# Patient Record
Sex: Female | Born: 1950
Health system: Southern US, Community
[De-identification: ages and names within clinical notes are randomized; demographics above are authoritative.]

## PROBLEM LIST (undated history)

## (undated) DIAGNOSIS — C50919 Malignant neoplasm of unspecified site of unspecified female breast: Secondary | ICD-10-CM

## (undated) DIAGNOSIS — T7840XA Allergy, unspecified, initial encounter: Secondary | ICD-10-CM

## (undated) DIAGNOSIS — C801 Malignant (primary) neoplasm, unspecified: Secondary | ICD-10-CM

## (undated) HISTORY — DX: Malignant (primary) neoplasm, unspecified: C80.1

## (undated) HISTORY — PX: MASTECTOMY: SHX3

## (undated) HISTORY — DX: Allergy, unspecified, initial encounter: T78.40XA

---
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
## 1987-09-20 HISTORY — PX: TUBAL LIGATION: SHX77

## 1993-09-19 HISTORY — PX: BREAST BIOPSY: SHX20

## 2001-09-19 HISTORY — PX: BREAST SURGERY: SHX581

## 2001-09-19 HISTORY — PX: OTHER SURGICAL HISTORY: SHX169

## 2002-09-19 DIAGNOSIS — C801 Malignant (primary) neoplasm, unspecified: Secondary | ICD-10-CM

## 2002-09-19 HISTORY — DX: Malignant (primary) neoplasm, unspecified: C80.1

## 2013-10-22 ENCOUNTER — Encounter (INDEPENDENT_AMBULATORY_CARE_PROVIDER_SITE_OTHER): Payer: Self-pay

## 2013-10-22 ENCOUNTER — Ambulatory Visit (INDEPENDENT_AMBULATORY_CARE_PROVIDER_SITE_OTHER): Payer: BC Managed Care – PPO | Admitting: Family

## 2013-10-22 ENCOUNTER — Encounter: Payer: Self-pay | Admitting: Family

## 2013-10-22 ENCOUNTER — Other Ambulatory Visit (HOSPITAL_COMMUNITY)
Admission: RE | Admit: 2013-10-22 | Discharge: 2013-10-22 | Disposition: A | Discharge status: Home / Self Care | Payer: BC Managed Care – PPO | Source: Ambulatory Visit | Attending: Family | Admitting: Family

## 2013-10-22 VITALS — BP 120/76 | HR 69 | Ht 65.5 in | Wt 159.0 lb

## 2013-10-22 DIAGNOSIS — Z853 Personal history of malignant neoplasm of breast: Secondary | ICD-10-CM

## 2013-10-22 DIAGNOSIS — Z0000 Encounter for general adult medical examination without abnormal findings: Secondary | ICD-10-CM

## 2013-10-22 DIAGNOSIS — Z124 Encounter for screening for malignant neoplasm of cervix: Secondary | ICD-10-CM

## 2013-10-22 DIAGNOSIS — Z01419 Encounter for gynecological examination (general) (routine) without abnormal findings: Secondary | ICD-10-CM | POA: Insufficient documentation

## 2013-10-22 NOTE — Progress Notes (Signed)
   Subjective:    Patient ID: Abigail Watkins, female    DOB: 05/07/1951, 63 y.o.   MRN: [id number]  HPI  63 year old white female, new patient to the practice and to be established and for complete physical exam. Denies any concerns today. Has a history of breast cancer with right mastectomy in 2004. She is currently being managed by oncology and sharp in [place]. She recently relocated to [facility] from [place] after purchasing a house and moving in with her boyfriend. Reports her deceased husband related to cardiomyopathy.   Review of Systems  Constitutional: Negative.   HENT: Negative.   Eyes: Negative.   Respiratory: Negative.   Cardiovascular: Negative.   Gastrointestinal: Negative.   Endocrine: Negative.   Genitourinary: Negative.   Musculoskeletal: Negative.   Skin: Negative.   Allergic/Immunologic: Negative.   Neurological: Negative.   Hematological: Negative.   Psychiatric/Behavioral: Negative.    Past Medical History  Diagnosis Date  . Cancer     breast    History   Social History  . Marital Status: Single    Spouse Name: N/A    Number of Children: N/A  . Years of Education: N/A   Occupational History  . Not on file.   Social History Main Topics  . Smoking status: Former Research scientist (life sciences)  . Smokeless tobacco: Not on file  . Alcohol Use: Yes  . Drug Use: No  . Sexual Activity: Not on file   Other Topics Concern  . Not on file   Social History Narrative  . No narrative on file    Past Surgical History  Procedure Laterality Date  . Breast surgery    . Tonsillectomy and adenoidectomy      Family History  Problem Relation Age of Onset  . Cancer Maternal Grandmother   . Breast cancer Paternal Grandmother     No Known Allergies  No current outpatient prescriptions on file prior to visit.   No current facility-administered medications on file prior to visit.    BP 120/76  Pulse 69  Ht 5' 5.5" (1.664 m)  Wt 159 lb (72.122 kg)  BMI 26.05  kg/m2chart    Objective:   Physical Exam  Constitutional: She is oriented to person, place, and time. She appears well-developed and well-nourished.  HENT:  Head: Normocephalic and atraumatic.  Right Ear: External ear normal.  Left Ear: External ear normal.  Mouth/Throat: Oropharynx is clear and moist.  Eyes: Conjunctivae and EOM are normal. Pupils are equal, round, and reactive to light.  Neck: Normal range of motion. Neck supple. No thyromegaly present.  Cardiovascular: Normal rate, regular rhythm and normal heart sounds.   Pulmonary/Chest: Effort normal and breath sounds normal.  Abdominal: Soft. Bowel sounds are normal. She exhibits no distension. There is no tenderness. There is no rebound.  Genitourinary: Vagina normal and uterus normal. Guaiac negative stool. No vaginal discharge found.  Musculoskeletal: Normal range of motion. She exhibits no edema and no tenderness.  Neurological: She is alert and oriented to person, place, and time. She has normal reflexes. She displays normal reflexes. No cranial nerve deficit. Coordination normal.  Skin: Skin is warm and dry.  Psychiatric: She has a normal mood and affect.          Assessment & Plan:  1. CPX- Encouraged a healthy diets, exercise, monthly self breast exams. Refer for colonoscopy. Pap smear sent. Follow-up with oncology as scheduled.

## 2013-10-22 NOTE — Progress Notes (Signed)
Pre visit review using our clinic review tool, if applicable. No additional management support is needed unless otherwise documented below in the visit note. 

## 2013-10-22 NOTE — Patient Instructions (Signed)
Exercise to Stay Healthy Exercise helps you become and stay healthy. EXERCISE IDEAS AND TIPS Choose exercises that:  You enjoy.  Fit into your day. You do not need to exercise really hard to be healthy. You can do exercises at a slow or medium level and stay healthy. You can:  Stretch before and after working out.  Try yoga, Pilates, or tai chi.  Lift weights.  Walk fast, swim, jog, run, climb stairs, bicycle, dance, or rollerskate.  Take aerobic classes. Exercises that burn about 150 calories:  Running 1  miles in 15 minutes.  Playing volleyball for 45 to 60 minutes.  Washing and waxing a car for 45 to 60 minutes.  Playing touch football for 45 minutes.  Walking 1  miles in 35 minutes.  Pushing a stroller 1  miles in 30 minutes.  Playing basketball for 30 minutes.  Raking leaves for 30 minutes.  Bicycling 5 miles in 30 minutes.  Walking 2 miles in 30 minutes.  Dancing for 30 minutes.  Shoveling snow for 15 minutes.  Swimming laps for 20 minutes.  Walking up stairs for 15 minutes.  Bicycling 4 miles in 15 minutes.  Gardening for 30 to 45 minutes.  Jumping rope for 15 minutes.  Washing windows or floors for 45 to 60 minutes. Document Released: 10/08/2010 Document Revised: 11/28/2011 Document Reviewed: 10/08/2010 [employer] Patient Information 2014 [employer].  

## 2013-10-23 DIAGNOSIS — Z853 Personal history of malignant neoplasm of breast: Secondary | ICD-10-CM | POA: Insufficient documentation

## 2013-10-29 ENCOUNTER — Other Ambulatory Visit (INDEPENDENT_AMBULATORY_CARE_PROVIDER_SITE_OTHER): Payer: BC Managed Care – PPO

## 2013-10-29 ENCOUNTER — Encounter: Payer: Self-pay | Admitting: Internal Medicine

## 2013-10-29 DIAGNOSIS — Z0000 Encounter for general adult medical examination without abnormal findings: Secondary | ICD-10-CM

## 2013-10-29 LAB — LIPID PANEL
CHOLESTEROL: 163 mg/dL (ref 0–200)
HDL: 78 mg/dL (ref >39.00)
LDL CALC: 75 mg/dL (ref 0–99)
Total CHOL/HDL Ratio: 2
Triglycerides: 51 mg/dL (ref 0.0–149.0)
VLDL: 10.2 mg/dL (ref 0.0–40.0)

## 2013-10-29 LAB — TSH: TSH: 0.88 u[IU]/mL (ref 0.35–5.50)

## 2013-10-29 LAB — COMPREHENSIVE METABOLIC PANEL
ALBUMIN: 4 g/dL (ref 3.5–5.2)
ALK PHOS: 52 U/L (ref 39–117)
ALT: 13 U/L (ref 0–35)
AST: 16 U/L (ref 0–37)
BILIRUBIN TOTAL: 0.7 mg/dL (ref 0.3–1.2)
BUN: 14 mg/dL (ref 6–23)
CO2: 27 meq/L (ref 19–32)
Calcium: 8.8 mg/dL (ref 8.4–10.5)
Chloride: 106 mEq/L (ref 96–112)
Creatinine, Ser: 0.8 mg/dL (ref 0.4–1.2)
GFR: 81.34 mL/min (ref >60.00)
Glucose, Bld: 87 mg/dL (ref 70–99)
Potassium: 4.3 mEq/L (ref 3.5–5.1)
SODIUM: 140 meq/L (ref 135–145)
TOTAL PROTEIN: 6.5 g/dL (ref 6.0–8.3)

## 2013-10-29 LAB — CBC WITH DIFFERENTIAL/PLATELET
BASOS PCT: 0.5 % (ref 0.0–3.0)
Basophils Absolute: 0 10*3/uL (ref 0.0–0.1)
Eosinophils Absolute: 0.1 10*3/uL (ref 0.0–0.7)
Eosinophils Relative: 3 % (ref 0.0–5.0)
HCT: 43.2 % (ref 36.0–46.0)
HEMOGLOBIN: 14.4 g/dL (ref 12.0–15.0)
Lymphocytes Relative: 30.1 % (ref 12.0–46.0)
Lymphs Abs: 1.3 10*3/uL (ref 0.7–4.0)
MCHC: 33.3 g/dL (ref 30.0–36.0)
MCV: 96.1 fl (ref 78.0–100.0)
Monocytes Absolute: 0.4 10*3/uL (ref 0.1–1.0)
Monocytes Relative: 9.9 % (ref 3.0–12.0)
NEUTROS ABS: 2.5 10*3/uL (ref 1.4–7.7)
Neutrophils Relative %: 56.5 % (ref 43.0–77.0)
Platelets: 284 10*3/uL (ref 150.0–400.0)
RBC: 4.49 Mil/uL (ref 3.87–5.11)
RDW: 13.1 % (ref 11.5–14.6)
WBC: 4.4 10*3/uL — AB (ref 4.5–10.5)

## 2013-10-30 ENCOUNTER — Other Ambulatory Visit: Payer: Self-pay

## 2013-12-26 ENCOUNTER — Ambulatory Visit (AMBULATORY_SURGERY_CENTER): Payer: Self-pay | Admitting: *Deleted

## 2013-12-26 VITALS — Ht 66.0 in | Wt 156.6 lb

## 2013-12-26 DIAGNOSIS — Z1211 Encounter for screening for malignant neoplasm of colon: Secondary | ICD-10-CM

## 2013-12-26 MED ORDER — NA SULFATE-K SULFATE-MG SULF 17.5-3.13-1.6 GM/177ML PO SOLN: 1.0000 | Freq: Once | ORAL | Status: DC

## 2013-12-26 NOTE — Progress Notes (Signed)
No allergies to eggs or soy. No problems with anesthesia.  Pt given Emmi instructions for colonoscopy  

## 2013-12-31 ENCOUNTER — Encounter: Payer: Self-pay | Admitting: Internal Medicine

## 2014-01-07 ENCOUNTER — Telehealth: Payer: Self-pay | Admitting: Internal Medicine

## 2014-01-07 DIAGNOSIS — Z1211 Encounter for screening for malignant neoplasm of colon: Secondary | ICD-10-CM

## 2014-01-07 MED ORDER — NA SULFATE-K SULFATE-MG SULF 17.5-3.13-1.6 GM/177ML PO SOLN: ORAL | Status: DC

## 2014-01-07 NOTE — Telephone Encounter (Signed)
RX sent to pt's new pharmacy and she is made aware of this

## 2014-01-09 ENCOUNTER — Ambulatory Visit (AMBULATORY_SURGERY_CENTER): Payer: BC Managed Care – PPO | Admitting: Internal Medicine

## 2014-01-09 ENCOUNTER — Encounter: Payer: Self-pay | Admitting: Internal Medicine

## 2014-01-09 VITALS — BP 147/77 | HR 62 | Temp 96.5°F | Resp 15 | Ht 66.0 in | Wt 156.0 lb

## 2014-01-09 DIAGNOSIS — Z1211 Encounter for screening for malignant neoplasm of colon: Secondary | ICD-10-CM

## 2014-01-09 MED ORDER — SODIUM CHLORIDE 0.9 % IV SOLN: 500.0000 mL | INTRAVENOUS | Status: DC

## 2014-01-09 NOTE — Op Note (Signed)
[place]  [address]. [place], [address]   COLONOSCOPY PROCEDURE REPORT  PATIENT: Oktober, Glazer  MR#: [id number] BIRTHDATE: Dec 17, 1950 , 43  yrs. old GENDER: Female ENDOSCOPIST: Gatha Mayer, MD, FACG REFERRED [id number] Campbell, FNP-BC PROCEDURE DATE:  01/09/2014 PROCEDURE:   Colonoscopy, screening First Screening Colonoscopy - Avg.  risk and is 50 yrs.  old or older - No.  Prior Negative Screening - Now for repeat screening. 10 or more years since last screening  History of Adenoma - Now for follow-up colonoscopy & has been > or = to 3 yrs.  N/A  Polyps Removed Today? No.  Recommend repeat exam, <10 yrs? No. ASA CLASS:   Class II INDICATIONS:average risk screening and Last colonoscopy performed 10+ yrs ago. MEDICATIONS: propofol (Diprivan) 200mg  IV, MAC sedation, administered by CRNA, and These medications were titrated to patient response per physician's verbal order  DESCRIPTION OF PROCEDURE:   After the risks benefits and alternatives of the procedure were thoroughly explained, informed consent was obtained.  A digital rectal exam revealed no abnormalities of the rectum.   The [id number]  endoscope was introduced through the anus and advanced to the cecum, which was identified by both the appendix and ileocecal valve. No adverse events experienced.   The quality of the prep was excellent using Suprep  The instrument was then slowly withdrawn as the colon was fully examined.      COLON FINDINGS: A normal appearing cecum, ileocecal valve, and appendiceal orifice were identified.  The ascending, hepatic flexure, transverse, splenic flexure, descending, sigmoid colon and rectum appeared unremarkable.  No polyps or cancers were seen. Retroflexed views revealed no abnormalities. The time to cecum=2 minutes 06 seconds.  Withdrawal time=9 minutes 18 seconds.  The scope was withdrawn and the procedure completed. COMPLICATIONS: There were no  complications.  ENDOSCOPIC IMPRESSION: Normal colonoscopy  RECOMMENDATIONS: Repeat colonoscopy 10 years.   eSigned:  Gatha Mayer, MD, [facility] 01/09/2014 9:24 AM   cc: Roxy Cedar FNP and The Patient

## 2014-01-09 NOTE — Progress Notes (Signed)
Discussed with patient elevated blood pressures. Encouraged her to check blood pressures at home and keep a record and if consistently elevated 140/90's then to touch base with her primary care doctor.

## 2014-01-09 NOTE — Patient Instructions (Addendum)
Your colonoscopy was normal.  Next routine colonoscopy in 10 years - 2025  I appreciate the opportunity to care for you. Carl E. Gessner, MD, FACG   YOU HAD AN ENDOSCOPIC PROCEDURE TODAY AT THE [facility]: Refer to the procedure report that was given to you for any specific questions about what was found during the examination.  If the procedure report does not answer your questions, please call your gastroenterologist to clarify.  If you requested that your care partner not be given the details of your procedure findings, then the procedure report has been included in a sealed envelope for you to review at your convenience later.  YOU SHOULD EXPECT: Some feelings of bloating in the abdomen. Passage of more gas than usual.  Walking can help get rid of the air that was put into your GI tract during the procedure and reduce the bloating. If you had a lower endoscopy (such as a colonoscopy or flexible sigmoidoscopy) you may notice spotting of blood in your stool or on the toilet paper. If you underwent a bowel prep for your procedure, then you may not have a normal bowel movement for a few days.  DIET: Your first meal following the procedure should be a light meal and then it is ok to progress to your normal diet.  A half-sandwich or bowl of soup is an example of a good first meal.  Heavy or fried foods are harder to digest and may make you feel nauseous or bloated.  Likewise meals heavy in dairy and vegetables can cause extra gas to form and this can also increase the bloating.  Drink plenty of fluids but you should avoid alcoholic beverages for 24 hours.  ACTIVITY: Your care partner should take you home directly after the procedure.  You should plan to take it easy, moving slowly for the rest of the day.  You can resume normal activity the day after the procedure however you should NOT DRIVE or use heavy machinery for 24 hours (because of the sedation medicines used during the test).     SYMPTOMS TO REPORT IMMEDIATELY: A gastroenterologist can be reached at any hour.  During normal business hours, 8:30 AM to 5:00 PM Monday through Friday, call [phone].  After hours and on weekends, please call the GI answering service at [phone] who will take a message and have the physician on call contact you.   Following lower endoscopy (colonoscopy or flexible sigmoidoscopy):  Excessive amounts of blood in the stool  Significant tenderness or worsening of abdominal pains  Swelling of the abdomen that is new, acute  Fever of 100F or higher  FOLLOW UP: If any biopsies were taken you will be contacted by phone or by letter within the next 1-3 weeks.  Call your gastroenterologist if you have not heard about the biopsies in 3 weeks.  Our staff will call the home number listed on your records the next business day following your procedure to check on you and address any questions or concerns that you may have at that time regarding the information given to you following your procedure. This is a courtesy call and so if there is no answer at the home number and we have not heard from you through the emergency physician on call, we will assume that you have returned to your regular daily activities without incident.  SIGNATURES/CONFIDENTIALITY: You and/or your care partner have signed paperwork which will be entered into your electronic medical record.  These   signatures attest to the fact that that the information above on your After Visit Summary has been reviewed and is understood.  Full responsibility of the confidentiality of this discharge information lies with you and/or your care-partner. 

## 2014-01-10 ENCOUNTER — Telehealth: Payer: Self-pay | Admitting: *Deleted

## 2014-01-10 NOTE — Telephone Encounter (Signed)
  Follow up Call-  Call back number 01/09/2014  Post procedure Call Back phone  # [phone]  Permission to leave phone message Yes     Patient questions:  Do you have a fever, pain , or abdominal swelling? no Pain Score  0 *  Have you tolerated food without any problems? yes  Have you been able to return to your normal activities? yes  Do you have any questions about your discharge instructions: Diet   no Medications  no Follow up visit  no  Do you have questions or concerns about your Care? no  Actions: * If pain score is 4 or above: No action needed, pain <4.

## 2015-03-12 ENCOUNTER — Encounter: Payer: Self-pay | Admitting: Internal Medicine

## 2015-04-16 ENCOUNTER — Encounter: Payer: Self-pay | Admitting: Internal Medicine

## 2015-04-16 ENCOUNTER — Ambulatory Visit (INDEPENDENT_AMBULATORY_CARE_PROVIDER_SITE_OTHER): Payer: Medicare PPO | Admitting: Internal Medicine

## 2015-04-16 VITALS — BP 162/92 | HR 69 | Temp 98.3°F | Resp 16 | Ht 67.0 in | Wt 158.1 lb

## 2015-04-16 DIAGNOSIS — Z23 Encounter for immunization: Secondary | ICD-10-CM | POA: Diagnosis not present

## 2015-04-16 DIAGNOSIS — Z0000 Encounter for general adult medical examination without abnormal findings: Secondary | ICD-10-CM | POA: Diagnosis not present

## 2015-04-16 NOTE — Progress Notes (Signed)
Pre visit review using our clinic review tool, if applicable. No additional management support is needed unless otherwise documented below in the visit note. 

## 2015-04-16 NOTE — Patient Instructions (Signed)
We will have you check the blood pressure 1-2 times per month. We are looking for the top number to be <140 and the bottom number to be <90. If you notice a lot of times when it is high call the office to come back in sooner.   We will see you back in about 6 months just to check the blood pressure.   Keep up the good work with your health, we do not need any blood work today.   We have given you the tetanus shot today. Think about calling [employer] to see if they would cover the shingles shot.   Health Maintenance Adopting a healthy lifestyle and getting preventive care can go a long way to promote health and wellness. Talk with your health care provider about what schedule of regular examinations is right for you. This is a good chance for you to check in with your provider about disease prevention and staying healthy. In between checkups, there are plenty of things you can do on your own. Experts have done a lot of research about which lifestyle changes and preventive measures are most likely to keep you healthy. Ask your health care provider for more information. WEIGHT AND DIET  Eat a healthy diet  Be sure to include plenty of vegetables, fruits, low-fat dairy products, and lean protein.  Do not eat a lot of foods high in solid fats, added sugars, or salt.  Get regular exercise. This is one of the most important things you can do for your health.  Most adults should exercise for at least 150 minutes each week. The exercise should increase your heart rate and make you sweat (moderate-intensity exercise).  Most adults should also do strengthening exercises at least twice a week. This is in addition to the moderate-intensity exercise.  Maintain a healthy weight  Body mass index (BMI) is a measurement that can be used to identify possible weight problems. It estimates body fat based on height and weight. Your health care provider can help determine your BMI and help you achieve or maintain a  healthy weight.  For females 23 years of age and older:   A BMI below 18.5 is considered underweight.  A BMI of 18.5 to 24.9 is normal.  A BMI of 25 to 29.9 is considered overweight.  A BMI of 30 and above is considered obese.  Watch levels of cholesterol and blood lipids  You should start having your blood tested for lipids and cholesterol at 66 years of age, then have this test every 5 years.  You may need to have your cholesterol levels checked more often if:  Your lipid or cholesterol levels are high.  You are older than 66 years of age.  You are at high risk for heart disease.  CANCER SCREENING   Lung Cancer  Lung cancer screening is recommended for adults 21-68 years old who are at high risk for lung cancer because of a history of smoking.  A yearly low-dose CT scan of the lungs is recommended for people who:  Currently smoke.  Have quit within the past 15 years.  Have at least a 30-pack-year history of smoking. A pack year is smoking an average of one pack of cigarettes a day for 1 year.  Yearly screening should continue until it has been 15 years since you quit.  Yearly screening should stop if you develop a health problem that would prevent you from having lung cancer treatment.  Breast Cancer  Practice breast  self-awareness. This means understanding how your breasts normally appear and feel.  It also means doing regular breast self-exams. Let your health care provider know about any changes, no matter how small.  If you are in your 20s or 30s, you should have a clinical breast exam (CBE) by a health care provider every 1-3 years as part of a regular health exam.  If you are 57 or older, have a CBE every year. Also consider having a breast X-ray (mammogram) every year.  If you have a family history of breast cancer, talk to your health care provider about genetic screening.  If you are at high risk for breast cancer, talk to your health care provider  about having an MRI and a mammogram every year.  Breast cancer gene (BRCA) assessment is recommended for women who have family members with BRCA-related cancers. BRCA-related cancers include:  Breast.  Ovarian.  Tubal.  Peritoneal cancers.  Results of the assessment will determine the need for genetic counseling and BRCA1 and BRCA2 testing. Cervical Cancer Routine pelvic examinations to screen for cervical cancer are no longer recommended for nonpregnant women who are considered low risk for cancer of the pelvic organs (ovaries, uterus, and vagina) and who do not have symptoms. A pelvic examination may be necessary if you have symptoms including those associated with pelvic infections. Ask your health care provider if a screening pelvic exam is right for you.   The Pap test is the screening test for cervical cancer for women who are considered at risk.  If you had a hysterectomy for a problem that was not cancer or a condition that could lead to cancer, then you no longer need Pap tests.  If you are older than 65 years, and you have had normal Pap tests for the past 10 years, you no longer need to have Pap tests.  If you have had past treatment for cervical cancer or a condition that could lead to cancer, you need Pap tests and screening for cancer for at least 20 years after your treatment.  If you no longer get a Pap test, assess your risk factors if they change (such as having a new sexual partner). This can affect whether you should start being screened again.  Some women have medical problems that increase their chance of getting cervical cancer. If this is the case for you, your health care provider may recommend more frequent screening and Pap tests.  The human papillomavirus (HPV) test is another test that may be used for cervical cancer screening. The HPV test looks for the virus that can cause cell changes in the cervix. The cells collected during the Pap test can be tested for  HPV.  The HPV test can be used to screen women 40 years of age and older. Getting tested for HPV can extend the interval between normal Pap tests from three to five years.  An HPV test also should be used to screen women of any age who have unclear Pap test results.  After 66 years of age, women should have HPV testing as often as Pap tests.  Colorectal Cancer  This type of cancer can be detected and often prevented.  Routine colorectal cancer screening usually begins at 66 years of age and continues through 66 years of age.  Your health care provider may recommend screening at an earlier age if you have risk factors for colon cancer.  Your health care provider may also recommend using home test kits to check  for hidden blood in the stool.  A small camera at the end of a tube can be used to examine your colon directly (sigmoidoscopy or colonoscopy). This is done to check for the earliest forms of colorectal cancer.  Routine screening usually begins at age 9.  Direct examination of the colon should be repeated every 5-10 years through 66 years of age. However, you may need to be screened more often if early forms of precancerous polyps or small growths are found. Skin Cancer  Check your skin from head to toe regularly.  Tell your health care provider about any new moles or changes in moles, especially if there is a change in a mole's shape or color.  Also tell your health care provider if you have a mole that is larger than the size of a pencil eraser.  Always use sunscreen. Apply sunscreen liberally and repeatedly throughout the day.  Protect yourself by wearing long sleeves, pants, a wide-brimmed hat, and sunglasses whenever you are outside. HEART DISEASE, DIABETES, AND HIGH BLOOD PRESSURE   Have your blood pressure checked at least every 1-2 years. High blood pressure causes heart disease and increases the risk of stroke.  If you are between 21 years and 51 years old, ask  your health care provider if you should take aspirin to prevent strokes.  Have regular diabetes screenings. This involves taking a blood sample to check your fasting blood sugar level.  If you are at a normal weight and have a low risk for diabetes, have this test once every three years after 66 years of age.  If you are overweight and have a high risk for diabetes, consider being tested at a younger age or more often. PREVENTING INFECTION  Hepatitis B  If you have a higher risk for hepatitis B, you should be screened for this virus. You are considered at high risk for hepatitis B if:  You were born in a country where hepatitis B is common. Ask your health care provider which countries are considered high risk.  Your parents were born in a high-risk country, and you have not been immunized against hepatitis B (hepatitis B vaccine).  You have HIV or AIDS.  You use needles to inject street drugs.  You live with someone who has hepatitis B.  You have had sex with someone who has hepatitis B.  You get hemodialysis treatment.  You take certain medicines for conditions, including cancer, organ transplantation, and autoimmune conditions. Hepatitis C  Blood testing is recommended for:  Everyone born from [birth year] through 1967.  Anyone with known risk factors for hepatitis C. Sexually transmitted infections (STIs)  You should be screened for sexually transmitted infections (STIs) including gonorrhea and chlamydia if:  You are sexually active and are younger than 66 years of age.  You are older than 66 years of age and your health care provider tells you that you are at risk for this type of infection.  Your sexual activity has changed since you were last screened and you are at an increased risk for chlamydia or gonorrhea. Ask your health care provider if you are at risk.  If you do not have HIV, but are at risk, it may be recommended that you take a prescription medicine daily to  prevent HIV infection. This is called pre-exposure prophylaxis (PrEP). You are considered at risk if:  You are sexually active and do not regularly use condoms or know the HIV status of your partner(s).  You take drugs  by injection.  You are sexually active with a partner who has HIV. Talk with your health care provider about whether you are at high risk of being infected with HIV. If you choose to begin PrEP, you should first be tested for HIV. You should then be tested every 3 months for as long as you are taking PrEP.  PREGNANCY   If you are premenopausal and you may become pregnant, ask your health care provider about preconception counseling.  If you may become pregnant, take 400 to 800 micrograms (mcg) of folic acid every day.  If you want to prevent pregnancy, talk to your health care provider about birth control (contraception). OSTEOPOROSIS AND MENOPAUSE   Osteoporosis is a disease in which the bones lose minerals and strength with aging. This can result in serious bone fractures. Your risk for osteoporosis can be identified using a bone density scan.  If you are 27 years of age or older, or if you are at risk for osteoporosis and fractures, ask your health care provider if you should be screened.  Ask your health care provider whether you should take a calcium or vitamin D supplement to lower your risk for osteoporosis.  Menopause may have certain physical symptoms and risks.  Hormone replacement therapy may reduce some of these symptoms and risks. Talk to your health care provider about whether hormone replacement therapy is right for you.  HOME CARE INSTRUCTIONS   Schedule regular health, dental, and eye exams.  Stay current with your immunizations.   Do not use any tobacco products including cigarettes, chewing tobacco, or electronic cigarettes.  If you are pregnant, do not drink alcohol.  If you are breastfeeding, limit how much and how often you drink  alcohol.  Limit alcohol intake to no more than 1 drink per day for nonpregnant women. One drink equals 12 ounces of beer, 5 ounces of wine, or 1 ounces of hard liquor.  Do not use street drugs.  Do not share needles.  Ask your health care provider for help if you need support or information about quitting drugs.  Tell your health care provider if you often feel depressed.  Tell your health care provider if you have ever been abused or do not feel safe at home. Document Released: 03/21/2011 Document Revised: 01/20/2014 Document Reviewed: 08/07/2013 [facility] Patient Information 2015 [place], [place]. This information is not intended to replace advice given to you by your health care provider. Make sure you discuss any questions you have with your health care provider.

## 2015-04-16 NOTE — Progress Notes (Signed)
   Subjective:    Patient ID: Abigail Watkins, female    DOB: 08-19-1951, 64 y.o.   MRN: [id number]  HPI Here for medicare wellness, no new complaints. Please see A/P for status and treatment of chronic medical problems.   Diet: heart healthy Physical activity: active Depression/mood screen: negative Hearing: intact to whispered voice Visual acuity: grossly normal, performs annual eye exam  ADLs: capable Fall risk: none Home safety: good Cognitive evaluation: intact to orientation, naming, recall and repetition EOL planning: adv directives discussed  I have personally reviewed and have noted 1. The patient's medical and social history - reviewed today no changes 2. Their use of alcohol, tobacco or illicit drugs 3. Their current medications and supplements 4. The patient's functional ability including ADL's, fall risks, home safety risks and hearing or visual impairment. 5. Diet and physical activities 6. Evidence for depression or mood disorders 7. Care team reviewed and updated (available in snapshot)  Review of Systems  Constitutional: Negative for fever, activity change, appetite change, fatigue and unexpected weight change.  HENT: Negative.   Eyes: Negative.   Respiratory: Negative for cough, chest tightness, shortness of breath and wheezing.   Cardiovascular: Negative for chest pain, palpitations and leg swelling.  Gastrointestinal: Negative for nausea, abdominal pain, diarrhea, constipation and abdominal distention.  Musculoskeletal: Negative.   Skin: Negative.   Neurological: Negative.   Psychiatric/Behavioral: Negative.       Objective:   Physical Exam  Constitutional: She is oriented to person, place, and time. She appears well-developed and well-nourished.  HENT:  Head: Normocephalic and atraumatic.  Eyes: EOM are normal.  Neck: Normal range of motion.  Cardiovascular: Normal rate and regular rhythm.   Pulmonary/Chest: Effort normal and breath sounds normal. No  respiratory distress. She has no wheezes.  Abdominal: Soft. She exhibits no distension. There is no tenderness. There is no rebound.  Musculoskeletal: She exhibits no edema.  Neurological: She is alert and oriented to person, place, and time. Coordination normal.  Skin: Skin is warm and dry.  Psychiatric: She has a normal mood and affect.   Filed Vitals:   04/16/15 1034  BP: 162/92  Pulse: 69  Temp: 98.3 F (36.8 C)  TempSrc: Oral  Resp: 16  Height: 5\' 7"  (1.702 m)  Weight: 158 lb 1.9 oz (71.723 kg)  SpO2: 98%      Assessment & Plan:  Tdap given at visit.

## 2015-04-16 NOTE — Assessment & Plan Note (Addendum)
BP mildly elevated today but normal at home. Given Tdap today. She will contact her insurance about shingles shot. Up to date on colonoscopy. Ordered mammogram today. 10 year list of screening given to her at visit.

## 2015-09-03 ENCOUNTER — Encounter: Payer: Self-pay | Admitting: Family

## 2015-09-03 ENCOUNTER — Ambulatory Visit (INDEPENDENT_AMBULATORY_CARE_PROVIDER_SITE_OTHER): Payer: Medicare PPO | Admitting: Family

## 2015-09-03 VITALS — BP 142/96 | HR 86 | Temp 97.9°F | Resp 16 | Ht 65.5 in | Wt 149.0 lb

## 2015-09-03 DIAGNOSIS — J0100 Acute maxillary sinusitis, unspecified: Secondary | ICD-10-CM

## 2015-09-03 DIAGNOSIS — Z23 Encounter for immunization: Secondary | ICD-10-CM | POA: Diagnosis not present

## 2015-09-03 DIAGNOSIS — J019 Acute sinusitis, unspecified: Secondary | ICD-10-CM | POA: Insufficient documentation

## 2015-09-03 MED ORDER — AMOXICILLIN-POT CLAVULANATE 875-125 MG PO TABS: 1.0000 | ORAL_TABLET | Freq: Two times a day (BID) | ORAL | Status: DC

## 2015-09-03 MED ORDER — HYDROCOD POLST-CPM POLST ER 10-8 MG/5ML PO SUER: 5.0000 mL | Freq: Every evening | ORAL | Status: DC | PRN

## 2015-09-03 NOTE — Patient Instructions (Signed)
Thank you for choosing [facility].  Summary/Instructions:  Your prescription(s) have been submitted to your pharmacy or been printed and provided for you. Please take as directed and contact our office if you believe you are having problem(s) with the medication(s) or have any questions.  If your symptoms worsen or fail to improve, please contact our office for further instruction, or in case of emergency go directly to the emergency room at the closest medical facility.   General Recommendations:    Please drink plenty of fluids.  Get plenty of rest   Sleep in humidified air  Use saline nasal sprays  Netti pot   OTC Medications:  Decongestants - helps relieve congestion   Flonase (generic fluticasone) or Nasacort (generic triamcinolone) - please make sure to use the "cross-over" technique at a 45 degree angle towards the opposite eye as opposed to straight up the nasal passageway.   Sudafed (generic pseudoephedrine - Note this is the one that is available behind the pharmacy counter); Products with phenylephrine (-PE) may also be used but is often not as effective as pseudoephedrine.   If you have HIGH BLOOD PRESSURE - Coricidin HBP; AVOID any product that is -D as this contains pseudoephedrine which may increase your blood pressure.  Afrin (oxymetazoline) every 6-8 hours for up to 3 days.   Allergies - helps relieve runny nose, itchy eyes and sneezing   Claritin (generic loratidine), Allegra (fexofenidine), or Zyrtec (generic cyrterizine) for runny nose. These medications should not cause drowsiness.  Note - Benadryl (generic diphenhydramine) may be used however may cause drowsiness  Cough -   Delsym or Robitussin (generic dextromethorphan)  Expectorants - helps loosen mucus to ease removal   Mucinex (generic guaifenesin) as directed on the package.  Headaches / General Aches   Tylenol (generic acetaminophen) - DO NOT EXCEED 3 grams (3,000 mg) in a 24  hour time period  Advil/Motrin (generic ibuprofen)   Sore Throat -   Salt water gargle   Chloraseptic (generic benzocaine) spray or lozenges / Sucrets (generic dyclonine)      

## 2015-09-03 NOTE — Progress Notes (Signed)
Pre visit review using our clinic review tool, if applicable. No additional management support is needed unless otherwise documented below in the visit note. 

## 2015-09-03 NOTE — Progress Notes (Signed)
   Subjective:    Patient ID: Abigail Watkins, female    DOB: May 30, 1951, 64 y.o.   MRN: [id number]  Chief Complaint  Patient presents with  . Nasal Congestion    Congestion, chills, body aches, fever, productive cough, sinus headache and pressure    HPI:  Abigail Watkins is a 64 y.o. female who  has a past medical history of Allergy and Cancer ([place]) (2004). and presents today for an acute office visit.   This is a new problem. Asosciated symptom of congestion, chills, body aches, fever, productive cough, and sinus headache and pressure for about 2 weeks. T-max of 100 and the last fever was about 2 days ago. Modifying factors include Tylenol and Netti pot which have not helped greatly. Over the course she has gotten a little better but then worsened again. Denies any recent antibiotic use.   No Known Allergies   No current outpatient prescriptions on file prior to visit.   No current facility-administered medications on file prior to visit.    Review of Systems  Constitutional: Positive for fever and chills.  HENT: Positive for congestion, sinus pressure and sore throat.   Respiratory: Positive for cough. Negative for chest tightness and shortness of breath.   Neurological: Positive for headaches.      Objective:    BP 142/96 mmHg  Pulse 86  Temp(Src) 97.9 F (36.6 C) (Oral)  Resp 16  Ht 5' 5.5" (1.664 m)  Wt 149 lb (67.586 kg)  BMI 24.41 kg/m2  SpO2 98% Nursing note and vital signs reviewed.  Physical Exam  Constitutional: She is oriented to person, place, and time. She appears well-developed and well-nourished. No distress.  HENT:  Right Ear: Hearing, tympanic membrane, external ear and ear canal normal.  Left Ear: Hearing, tympanic membrane, external ear and ear canal normal.  Nose: Right sinus exhibits maxillary sinus tenderness and frontal sinus tenderness. Left sinus exhibits maxillary sinus tenderness and frontal sinus tenderness.  Mouth/Throat: Uvula is midline,  oropharynx is clear and moist and mucous membranes are normal.  Neck: Neck supple.  Cardiovascular: Normal rate, regular rhythm, normal heart sounds and intact distal pulses.   Pulmonary/Chest: Effort normal and breath sounds normal.  Neurological: She is alert and oriented to person, place, and time.  Skin: Skin is warm and dry.  Psychiatric: She has a normal mood and affect. Her behavior is normal. Judgment and thought content normal.       Assessment & Plan:   Problem List Items Addressed This Visit      Respiratory   Acute sinusitis    Symptoms and exam consistent with bacterial sinusitis. Start Augmentin. Start Tussionex as needed for cough and sleep. Continue over-the-counter medications as needed for comfort and supportive care. Follow-up if symptoms worsen or fail to improve.      Relevant Medications   amoxicillin-clavulanate (AUGMENTIN) 875-125 MG tablet   chlorpheniramine-HYDROcodone (TUSSIONEX PENNKINETIC ER) 10-8 MG/5ML SUER    Other Visit Diagnoses    Encounter for immunization    -  Primary

## 2015-09-03 NOTE — Assessment & Plan Note (Signed)
Symptoms and exam consistent with bacterial sinusitis. Start Augmentin. Start Tussionex as needed for cough and sleep. Continue over-the-counter medications as needed for comfort and supportive care. Follow-up if symptoms worsen or fail to improve.

## 2015-10-14 ENCOUNTER — Ambulatory Visit: Payer: Medicare PPO | Admitting: Internal Medicine

## 2015-12-24 ENCOUNTER — Ambulatory Visit: Payer: Medicare PPO | Admitting: Internal Medicine

## 2016-06-24 ENCOUNTER — Other Ambulatory Visit: Payer: Self-pay | Admitting: Internal Medicine

## 2016-06-24 DIAGNOSIS — Z1231 Encounter for screening mammogram for malignant neoplasm of breast: Secondary | ICD-10-CM

## 2016-07-13 ENCOUNTER — Ambulatory Visit
Admission: RE | Admit: 2016-07-13 | Discharge: 2016-07-13 | Disposition: A | Discharge status: Home / Self Care | Payer: Medicare Other | Source: Ambulatory Visit | Attending: Internal Medicine | Admitting: Internal Medicine

## 2016-07-13 DIAGNOSIS — Z1231 Encounter for screening mammogram for malignant neoplasm of breast: Secondary | ICD-10-CM

## 2016-07-26 ENCOUNTER — Encounter: Payer: Self-pay | Admitting: Internal Medicine

## 2016-07-26 ENCOUNTER — Other Ambulatory Visit (INDEPENDENT_AMBULATORY_CARE_PROVIDER_SITE_OTHER): Payer: Medicare Other

## 2016-07-26 ENCOUNTER — Ambulatory Visit (INDEPENDENT_AMBULATORY_CARE_PROVIDER_SITE_OTHER): Payer: Medicare Other | Admitting: Internal Medicine

## 2016-07-26 VITALS — BP 150/70 | HR 72 | Temp 98.0°F | Resp 14 | Ht 66.0 in | Wt 158.0 lb

## 2016-07-26 DIAGNOSIS — Z23 Encounter for immunization: Secondary | ICD-10-CM | POA: Diagnosis not present

## 2016-07-26 DIAGNOSIS — Z0000 Encounter for general adult medical examination without abnormal findings: Secondary | ICD-10-CM | POA: Diagnosis not present

## 2016-07-26 DIAGNOSIS — R030 Elevated blood-pressure reading, without diagnosis of hypertension: Secondary | ICD-10-CM

## 2016-07-26 DIAGNOSIS — Z853 Personal history of malignant neoplasm of breast: Secondary | ICD-10-CM

## 2016-07-26 LAB — CBC
HEMATOCRIT: 43 % (ref 36.0–46.0)
HEMOGLOBIN: 14.6 g/dL (ref 12.0–15.0)
MCHC: 34.1 g/dL (ref 30.0–36.0)
MCV: 93.1 fl (ref 78.0–100.0)
PLATELETS: 294 10*3/uL (ref 150.0–400.0)
RBC: 4.61 Mil/uL (ref 3.87–5.11)
RDW: 13.4 % (ref 11.5–15.5)
WBC: 6.1 10*3/uL (ref 4.0–10.5)

## 2016-07-26 LAB — LIPID PANEL
CHOL/HDL RATIO: 2
Cholesterol: 182 mg/dL (ref 0–200)
HDL: 82.2 mg/dL (ref >39.00)
LDL CALC: 87 mg/dL (ref 0–99)
NONHDL: 99.84
Triglycerides: 62 mg/dL (ref 0.0–149.0)
VLDL: 12.4 mg/dL (ref 0.0–40.0)

## 2016-07-26 LAB — COMPREHENSIVE METABOLIC PANEL
ALBUMIN: 4.4 g/dL (ref 3.5–5.2)
ALT: 11 U/L (ref 0–35)
AST: 13 U/L (ref 0–37)
Alkaline Phosphatase: 49 U/L (ref 39–117)
BUN: 18 mg/dL (ref 6–23)
CALCIUM: 9.4 mg/dL (ref 8.4–10.5)
CHLORIDE: 103 meq/L (ref 96–112)
CO2: 31 meq/L (ref 19–32)
Creatinine, Ser: 0.82 mg/dL (ref 0.40–1.20)
GFR: 73.88 mL/min (ref >60.00)
Glucose, Bld: 106 mg/dL — ABNORMAL HIGH (ref 70–99)
POTASSIUM: 4.4 meq/L (ref 3.5–5.1)
Sodium: 138 mEq/L (ref 135–145)
Total Bilirubin: 0.6 mg/dL (ref 0.2–1.2)
Total Protein: 6.8 g/dL (ref 6.0–8.3)

## 2016-07-26 NOTE — Addendum Note (Signed)
Addended by: Resa Miner R on: 07/26/2016 01:12 PM   Modules accepted: Orders

## 2016-07-26 NOTE — Progress Notes (Signed)
   Subjective:    Patient ID: Abigail Watkins, female    DOB: 11/13/1950, 65 y.o.   MRN: [id number]  HPI Here for medicare wellness and physical, no new complaints. Please see A/P for status and treatment of chronic medical problems.   Diet: heart healthy Physical activity: walks 4 miles daily Depression/mood screen: negative Hearing: intact to whispered voice Visual acuity: grossly normal, performs annual eye exam  ADLs: capable Fall risk: none Home safety: good Cognitive evaluation: intact to orientation, naming, recall and repetition EOL planning: adv directives discussed, in place  I have personally reviewed and have noted 1. The patient's medical and social history - reviewed today no changes 2. Their use of alcohol, tobacco or illicit drugs 3. Their current medications and supplements 4. The patient's functional ability including ADL's, fall risks, home safety risks and hearing or visual impairment. 5. Diet and physical activities 6. Evidence for depression or mood disorders 7. Care team reviewed and updated (available in snapshot)  Review of Systems  Constitutional: Negative.   HENT: Negative.   Eyes: Negative.   Respiratory: Negative for cough, chest tightness and shortness of breath.   Cardiovascular: Negative for chest pain, palpitations and leg swelling.  Gastrointestinal: Negative for abdominal distention, abdominal pain, constipation, diarrhea, nausea and vomiting.  Musculoskeletal: Negative.   Skin: Negative.   Neurological: Negative.   Psychiatric/Behavioral: Negative.       Objective:   Physical Exam  Constitutional: She is oriented to person, place, and time. She appears well-developed and well-nourished.  HENT:  Head: Normocephalic and atraumatic.  Eyes: EOM are normal.  Neck: Normal range of motion.  Cardiovascular: Normal rate and regular rhythm.   Pulmonary/Chest: Effort normal and breath sounds normal. No respiratory distress. She has no wheezes. She  has no rales.  Abdominal: Soft. Bowel sounds are normal. She exhibits no distension. There is no tenderness. There is no rebound.  Musculoskeletal: She exhibits no edema.  Neurological: She is alert and oriented to person, place, and time. Coordination normal.  Skin: Skin is warm and dry.  Psychiatric: She has a normal mood and affect.   Vitals:   07/26/16 1009 07/26/16 1050  BP: (!) 180/80 (!) 150/70  Pulse: 72   Resp: 14   Temp: 98 F (36.7 C)   TempSrc: Oral   SpO2: 98%   Weight: 158 lb (71.7 kg)   Height: 5\' 6"  (1.676 m)       Assessment & Plan:  Flu and prevnar 13 given at visit.

## 2016-07-26 NOTE — Assessment & Plan Note (Signed)
Recent mammogram normal. >10 years prior.

## 2016-07-26 NOTE — Assessment & Plan Note (Signed)
She denies it being high at home and will monitor with log and return with that next visit.

## 2016-07-26 NOTE — Progress Notes (Signed)
Pre visit review using our clinic review tool, if applicable. No additional management support is needed unless otherwise documented below in the visit note. 

## 2016-07-26 NOTE — Assessment & Plan Note (Signed)
BP mildly elevated and she will monitor at home. Checking labs and adjust as needed. Given flu and prevnar 13 shot at visit. Mammogram done recently and reviewed with her at visit, colonoscopy due in 2025. Counseled about sun safety and mole surveillance as well as the dangers of distracted driving. Given 10 year screening recommendations.

## 2016-07-26 NOTE — Patient Instructions (Addendum)
We will check the labs today and call you back with the results.   We have given you the pneumonia shot and the flu shot today.   For the blood pressure we are looking for the top number to be less than 140 and the bottom number to be less than 90. Call us if it is staying high.   Health Maintenance, Female Adopting a healthy lifestyle and getting preventive care can go a long way to promote health and wellness. Talk with your health care provider about what schedule of regular examinations is right for you. This is a good chance for you to check in with your provider about disease prevention and staying healthy. In between checkups, there are plenty of things you can do on your own. Experts have done a lot of research about which lifestyle changes and preventive measures are most likely to keep you healthy. Ask your health care provider for more information. WEIGHT AND DIET  Eat a healthy diet  Be sure to include plenty of vegetables, fruits, low-fat dairy products, and lean protein.  Do not eat a lot of foods high in solid fats, added sugars, or salt.  Get regular exercise. This is one of the most important things you can do for your health.  Most adults should exercise for at least 150 minutes each week. The exercise should increase your heart rate and make you sweat (moderate-intensity exercise).  Most adults should also do strengthening exercises at least twice a week. This is in addition to the moderate-intensity exercise.  Maintain a healthy weight  Body mass index (BMI) is a measurement that can be used to identify possible weight problems. It estimates body fat based on height and weight. Your health care provider can help determine your BMI and help you achieve or maintain a healthy weight.  For females 67 years of age and older:   A BMI below 18.5 is considered underweight.  A BMI of 18.5 to 24.9 is normal.  A BMI of 25 to 29.9 is considered overweight.  A BMI of 30 and  above is considered obese.  Watch levels of cholesterol and blood lipids  You should start having your blood tested for lipids and cholesterol at 67 years of age, then have this test every 5 years.  You may need to have your cholesterol levels checked more often if:  Your lipid or cholesterol levels are high.  You are older than 67 years of age.  You are at high risk for heart disease.  CANCER SCREENING   Lung Cancer  Lung cancer screening is recommended for adults 67-29 years old who are at high risk for lung cancer because of a history of smoking.  A yearly low-dose CT scan of the lungs is recommended for people who:  Currently smoke.  Have quit within the past 15 years.  Have at least a 30-pack-year history of smoking. A pack year is smoking an average of one pack of cigarettes a day for 1 year.  Yearly screening should continue until it has been 15 years since you quit.  Yearly screening should stop if you develop a health problem that would prevent you from having lung cancer treatment.  Breast Cancer  Practice breast self-awareness. This means understanding how your breasts normally appear and feel.  It also means doing regular breast self-exams. Let your health care provider know about any changes, no matter how small.  If you are in your 67s or 30s, you should have  a clinical breast exam (CBE) by a health care provider every 1-3 years as part of a regular health exam.  If you are 67 or older, have a CBE every year. Also consider having a breast X-ray (mammogram) every year.  If you have a family history of breast cancer, talk to your health care provider about genetic screening.  If you are at high risk for breast cancer, talk to your health care provider about having an MRI and a mammogram every year.  Breast cancer gene (BRCA) assessment is recommended for women who have family members with BRCA-related cancers. BRCA-related cancers  include:  Breast.  Ovarian.  Tubal.  Peritoneal cancers.  Results of the assessment will determine the need for genetic counseling and BRCA1 and BRCA2 testing. Cervical Cancer Your health care provider may recommend that you be screened regularly for cancer of the pelvic organs (ovaries, uterus, and vagina). This screening involves a pelvic examination, including checking for microscopic changes to the surface of your cervix (Pap test). You may be encouraged to have this screening done every 3 years, beginning at age 67.  For women ages 67-65, health care providers may recommend pelvic exams and Pap testing every 3 years, or they may recommend the Pap and pelvic exam, combined with testing for human papilloma virus (HPV), every 5 years. Some types of HPV increase your risk of cervical cancer. Testing for HPV may also be done on women of any age with unclear Pap test results.  Other health care providers may not recommend any screening for nonpregnant women who are considered low risk for pelvic cancer and who do not have symptoms. Ask your health care provider if a screening pelvic exam is right for you.  If you have had past treatment for cervical cancer or a condition that could lead to cancer, you need Pap tests and screening for cancer for at least 20 years after your treatment. If Pap tests have been discontinued, your risk factors (such as having a new sexual partner) need to be reassessed to determine if screening should resume. Some women have medical problems that increase the chance of getting cervical cancer. In these cases, your health care provider may recommend more frequent screening and Pap tests. Colorectal Cancer  This type of cancer can be detected and often prevented.  Routine colorectal cancer screening usually begins at 67 years of age and continues through 67 years of age.  Your health care provider may recommend screening at an earlier age if you have risk factors for  colon cancer.  Your health care provider may also recommend using home test kits to check for hidden blood in the stool.  A small camera at the end of a tube can be used to examine your colon directly (sigmoidoscopy or colonoscopy). This is done to check for the earliest forms of colorectal cancer.  Routine screening usually begins at age 39.  Direct examination of the colon should be repeated every 5-10 years through 67 years of age. However, you may need to be screened more often if early forms of precancerous polyps or small growths are found. Skin Cancer  Check your skin from head to toe regularly.  Tell your health care provider about any new moles or changes in moles, especially if there is a change in a mole's shape or color.  Also tell your health care provider if you have a mole that is larger than the size of a pencil eraser.  Always use sunscreen. Apply sunscreen  liberally and repeatedly throughout the day.  Protect yourself by wearing long sleeves, pants, a wide-brimmed hat, and sunglasses whenever you are outside. HEART DISEASE, DIABETES, AND HIGH BLOOD PRESSURE   High blood pressure causes heart disease and increases the risk of stroke. High blood pressure is more likely to develop in:  People who have blood pressure in the high end of the normal range (130-139/85-89 mm Hg).  People who are overweight or obese.  People who are African American.  If you are 40-36 years of age, have your blood pressure checked every 3-5 years. If you are 99 years of age or older, have your blood pressure checked every year. You should have your blood pressure measured twice--once when you are at a hospital or clinic, and once when you are not at a hospital or clinic. Record the average of the two measurements. To check your blood pressure when you are not at a hospital or clinic, you can use:  An automated blood pressure machine at a pharmacy.  A home blood pressure monitor.  If you  are between 28 years and 66 years old, ask your health care provider if you should take aspirin to prevent strokes.  Have regular diabetes screenings. This involves taking a blood sample to check your fasting blood sugar level.  If you are at a normal weight and have a low risk for diabetes, have this test once every three years after 67 years of age.  If you are overweight and have a high risk for diabetes, consider being tested at a younger age or more often. PREVENTING INFECTION  Hepatitis B  If you have a higher risk for hepatitis B, you should be screened for this virus. You are considered at high risk for hepatitis B if:  You were born in a country where hepatitis B is common. Ask your health care provider which countries are considered high risk.  Your parents were born in a high-risk country, and you have not been immunized against hepatitis B (hepatitis B vaccine).  You have HIV or AIDS.  You use needles to inject street drugs.  You live with someone who has hepatitis B.  You have had sex with someone who has hepatitis B.  You get hemodialysis treatment.  You take certain medicines for conditions, including cancer, organ transplantation, and autoimmune conditions. Hepatitis C  Blood testing is recommended for:  Everyone born from [birth year] through 1967.  Anyone with known risk factors for hepatitis C. Sexually transmitted infections (STIs)  You should be screened for sexually transmitted infections (STIs) including gonorrhea and chlamydia if:  You are sexually active and are younger than 67 years of age.  You are older than 67 years of age and your health care provider tells you that you are at risk for this type of infection.  Your sexual activity has changed since you were last screened and you are at an increased risk for chlamydia or gonorrhea. Ask your health care provider if you are at risk.  If you do not have HIV, but are at risk, it may be recommended that you  take a prescription medicine daily to prevent HIV infection. This is called pre-exposure prophylaxis (PrEP). You are considered at risk if:  You are sexually active and do not regularly use condoms or know the HIV status of your partner(s).  You take drugs by injection.  You are sexually active with a partner who has HIV. Talk with your health care provider about whether you  are at high risk of being infected with HIV. If you choose to begin PrEP, you should first be tested for HIV. You should then be tested every 3 months for as long as you are taking PrEP.  PREGNANCY   If you are premenopausal and you may become pregnant, ask your health care provider about preconception counseling.  If you may become pregnant, take 400 to 800 micrograms (mcg) of folic acid every day.  If you want to prevent pregnancy, talk to your health care provider about birth control (contraception). OSTEOPOROSIS AND MENOPAUSE   Osteoporosis is a disease in which the bones lose minerals and strength with aging. This can result in serious bone fractures. Your risk for osteoporosis can be identified using a bone density scan.  If you are 68 years of age or older, or if you are at risk for osteoporosis and fractures, ask your health care provider if you should be screened.  Ask your health care provider whether you should take a calcium or vitamin D supplement to lower your risk for osteoporosis.  Menopause may have certain physical symptoms and risks.  Hormone replacement therapy may reduce some of these symptoms and risks. Talk to your health care provider about whether hormone replacement therapy is right for you.  HOME CARE INSTRUCTIONS   Schedule regular health, dental, and eye exams.  Stay current with your immunizations.   Do not use any tobacco products including cigarettes, chewing tobacco, or electronic cigarettes.  If you are pregnant, do not drink alcohol.  If you are breastfeeding, limit how  much and how often you drink alcohol.  Limit alcohol intake to no more than 1 drink per day for nonpregnant women. One drink equals 12 ounces of beer, 5 ounces of wine, or 1 ounces of hard liquor.  Do not use street drugs.  Do not share needles.  Ask your health care provider for help if you need support or information about quitting drugs.  Tell your health care provider if you often feel depressed.  Tell your health care provider if you have ever been abused or do not feel safe at home.   This information is not intended to replace advice given to you by your health care provider. Make sure you discuss any questions you have with your health care provider.   Document Released: 03/21/2011 Document Revised: 09/26/2014 Document Reviewed: 08/07/2013 Elsevier Interactive Patient Education [employer].

## 2016-09-05 ENCOUNTER — Encounter: Payer: Self-pay | Admitting: Internal Medicine

## 2016-09-05 ENCOUNTER — Ambulatory Visit (INDEPENDENT_AMBULATORY_CARE_PROVIDER_SITE_OTHER): Payer: Medicare Other | Admitting: Internal Medicine

## 2016-09-05 DIAGNOSIS — M25511 Pain in right shoulder: Secondary | ICD-10-CM | POA: Diagnosis not present

## 2016-09-05 MED ORDER — PREDNISONE 20 MG PO TABS: 40.0000 mg | ORAL_TABLET | Freq: Every day | ORAL | 0 refills | Status: DC

## 2016-09-05 MED ORDER — KETOPROFEN 10 % CREA: TOPICAL_CREAM | 3 refills | Status: DC

## 2016-09-05 NOTE — Patient Instructions (Signed)
We have sent in prednisone for the inflammation. Take 2 pills daily for 5 days then stop.   We have sent in the cream for you as well to try for pain.   It is okay to keep using the ibuprofen for pain. And you can also try tylenol to help as well.

## 2016-09-05 NOTE — Assessment & Plan Note (Signed)
Rx for short burst prednisone and ketoprofen cream for the pain. No evidence of tendon tear on exam and no swelling. No red flag symptoms to need further evaluation now.

## 2016-09-05 NOTE — Progress Notes (Signed)
   Subjective:    Patient ID: Abigail Watkins, female    DOB: Nov 29, 1950, 65 y.o.   MRN: [id number]  HPI The patient is a 65 YO female coming in for right shoulder pain for about 2 weeks. No injury at onset or overuse. No fevers or chills. Some pain initially in the shoulder blade and shoulder itself. There is no limitation of ROM now. The pain is getting better gradually. She has been taking ibuprofen since it started. This has happened before about 1 year ago but went away sooner and she cannot recall anything that helped. She has also tried ice and heat and they did not help much.   Review of Systems  Constitutional: Positive for activity change. Negative for appetite change, chills, fatigue, fever and unexpected weight change.  Respiratory: Negative.   Cardiovascular: Negative.   Gastrointestinal: Negative.   Musculoskeletal: Positive for arthralgias. Negative for back pain, gait problem, joint swelling, myalgias, neck pain and neck stiffness.  Skin: Negative.       Objective:   Physical Exam  Constitutional: She is oriented to person, place, and time. She appears well-developed and well-nourished.  HENT:  Head: Normocephalic and atraumatic.  Eyes: EOM are normal.  Neck: Normal range of motion.  Cardiovascular: Normal rate and regular rhythm.   Pulmonary/Chest: Effort normal. No respiratory distress. She has no wheezes. She has no rales.  Abdominal: Soft. She exhibits no distension. There is no tenderness. There is no rebound.  Musculoskeletal: She exhibits tenderness.  Some soreness in the shoulder blade and the shoulder without radiation. Sensation intact and no weakness in the right arm.   Neurological: She is alert and oriented to person, place, and time.  Skin: Skin is warm and dry.   Vitals:   09/05/16 1106 09/05/16 1126  BP: (!) 186/90 (!) 150/78  Pulse: 72   Resp: 16   Temp: 98.3 F (36.8 C)   TempSrc: Oral   SpO2: 98%   Weight: 154 lb (69.9 kg)   Height: 5' 6.5"  (1.689 m)       Assessment & Plan:

## 2016-09-05 NOTE — Progress Notes (Signed)
Pre visit review using our clinic review tool, if applicable. No additional management support is needed unless otherwise documented below in the visit note. 

## 2017-02-06 ENCOUNTER — Telehealth: Payer: Medicare Other | Admitting: Family

## 2017-02-06 DIAGNOSIS — A6920 Lyme disease, unspecified: Secondary | ICD-10-CM

## 2017-02-06 MED ORDER — DOXYCYCLINE HYCLATE 100 MG PO TABS: 100.0000 mg | ORAL_TABLET | Freq: Two times a day (BID) | ORAL | 0 refills | Status: DC

## 2017-02-06 NOTE — Progress Notes (Signed)
Thank you for all the details in the comment boxes. These details are very helpful and allow [place] to provide the best treatment. Based on the size of the redness, we should proceed with caution. Also, given that some time has passed (2 weeks) and there is still a rash, we need to treat this differently than if we just found a tick today and removed it (usually no rash that early).   Thank you for describing your tick bite, Here is how we plan to help! Based on the information that you shared with me it looks like you have A tick that bite that we will treat with a short course of doxycycline.  In most cases a tick bite is painless and does not itch.  Most tick bites in which the tick is quickly removed do not require prescriptions. Ticks can transmit several diseases if they are infected and remain attacked to your skin. Therefore the length that the tick was attached and any symptoms you have experienced after the bite are import to accurately develop your custom treatment plan. In most cases a single dose of doxycycline may prevent the development of a more serious condition.  Based on your information I have Provided a home care guide for tick bites  and  instructions on when to call for help. and Your symptoms indicate that you need a longer course of antibiotics and a follow up visit with a provider. I have sent doxycycline 100 mg twice a day for 21 days to the pharmacy that you selected. You will need to schedule a follow up visit with your provider. If you do not have a primary care provider you may use our telehealth physicians on the web at [place].  Which ticks  are associated with illness?  The Wood Tick (dog tick) is the size of a watermelon seed and can sometimes transmit [facility] spotted fever and [place] tick fever.   The Deer Tick (black-legged tick) is between the size of a poppy seed (pin head) and an apple seed, and can sometimes transmit Lyme disease.  A brown to  black tick with a white splotch on its back is likely a female Amblyomma americanum ([place] tick). This tick has been associated with Southern Tick Associated illness ( STARI)  Lyme disease has become the most common tick-borne illness in the [place]. The risk of Lyme disease following a recognized deer tick bite is estimated to be 1%.  The majority of cases of Lyme disease start with a bull's eye rash at the site of the tick bite. The rash can occur days to weeks (typically 7-10 days) after a tick bite. Treatment with antibiotics is indicated if this rash appears. Flu-like symptoms may accompany the rash, including: fever, chills, headaches, muscle aches, and fatigue. Removing ticks promptly may prevent tick borne disease.  What can be used to prevent Tick Bites?   Insect repellant with at leas 20% DEET.  Wearing long pants with sock and shoes.  Avoiding tall grass and heavily wooded areas.  Checking your skin after being outdoors.  Shower with a washcloth after outdoor exposures.  HOME CARE ADVICE FOR TICK BITE  1. Wood Tick Removal:  o Use a pair of tweezers and grasp the wood tick close to the skin (on its head). Pull the wood tick straight upward without twisting or crushing it. Maintain a steady pressure until it releases its grip.   o If tweezers aren't available, use fingers, a loop of  thread around the jaws, or a needle between the jaws for traction.  o Note: covering the tick with petroleum jelly, nail polish or rubbing alcohol doesn't work. Neither does touching the tick with a hot or cold object. 2. Tiny Deer Tick Removal:   o Needs to be scraped off with a knife blade or credit card edge. o Place tick in a sealed container (e.g. glass jar, zip lock plastic bag), in case your doctor wants to see it. 3. Tick's Head Removal:  o If the wood tick's head breaks off in the skin, it must be removed. Clean the skin. Then use a sterile needle to uncover the head and lift it  out or scrape it off.  o If a very small piece of the head remains, the skin will eventually slough it off. 4. Antibiotic Ointment:  o Wash the wound and your hands with soap and water after removal to prevent catching any tick disease.  Apply an over the counter antibiotic ointment (e.g. bacitracin) to the bite once. 5. Expected Course: Tick bites normally don't itch or hurt. That's why they often go unnoticed. 6. Call Your Doctor If:  o You can't remove the tick or the tick's head o Fever, a severe head ache, or rash occur in the next 2 weeks o Bite begins to look infected o Lyme's disease is common in your area o You have not had a tetanus in the last 10 years o Your current symptoms become worse    MAKE SURE YOU   Understand these instructions.  Will watch your condition.  Will get help right away if you are not doing well or get worse.   Thank you for choosing an e-visit.  Your e-visit answers were reviewed by a board certified advanced clinical practitioner to complete your personal care plan. Depending upon the condition, your plan could have included both over the counter or prescription medications. Please review your pharmacy choice. If there is a problem you may use MyChart messaging to have the prescription routed to another pharmacy. Your safety is important to [place]. If you have drug allergies check your prescription carefully.   You can use MyChart to ask questions about today's visit, request a non-urgent call back, or ask for a work or school excuse for 24 hours related to this e-Visit. If it has been greater than 24 hours you will need to follow up with your provider, or enter a new e-Visit to address those concerns.  You will get an email in the next two days asking about your experience. I hope  that your e-visit has been valuable and will speed your recovery

## 2017-06-29 ENCOUNTER — Ambulatory Visit (INDEPENDENT_AMBULATORY_CARE_PROVIDER_SITE_OTHER): Payer: Medicare Other | Admitting: Internal Medicine

## 2017-06-29 ENCOUNTER — Encounter: Payer: Self-pay | Admitting: Internal Medicine

## 2017-06-29 VITALS — BP 160/90 | HR 92 | Temp 98.1°F | Ht 66.5 in | Wt 153.0 lb

## 2017-06-29 DIAGNOSIS — M25511 Pain in right shoulder: Secondary | ICD-10-CM

## 2017-06-29 DIAGNOSIS — Z23 Encounter for immunization: Secondary | ICD-10-CM | POA: Diagnosis not present

## 2017-06-29 MED ORDER — PREDNISONE 20 MG PO TABS: 40.0000 mg | ORAL_TABLET | Freq: Every day | ORAL | 0 refills | Status: DC

## 2017-06-29 NOTE — Progress Notes (Signed)
   Subjective:    Patient ID: Abigail Watkins, female    DOB: 10-26-1950, 66 y.o.   MRN: [id number]  HPI The patient is a 66 YO female coming in for right shoulder pain. Started about 2 weeks ago. Denies injury or overuse. Some rare tingling in her arm. Worse with certain movements. Walking seems to help. Yoga made it worse. Tried ice which did not help. Tried ibuprofen and tylenol which took the edge off. Pain 5/10. Not improving since onset. Denies fevers or chills. No chest pains.   Review of Systems  Constitutional: Positive for activity change. Negative for appetite change, diaphoresis, fatigue, fever and unexpected weight change.  Respiratory: Negative for cough, chest tightness and shortness of breath.   Cardiovascular: Negative for chest pain, palpitations and leg swelling.  Gastrointestinal: Negative for abdominal distention, abdominal pain, constipation, diarrhea, nausea and vomiting.  Musculoskeletal: Positive for arthralgias and myalgias.  Skin: Negative.   Neurological: Negative.   Psychiatric/Behavioral: Negative.       Objective:   Physical Exam  Constitutional: She is oriented to person, place, and time. She appears well-developed and well-nourished.  HENT:  Head: Normocephalic and atraumatic.  Neck: Normal range of motion.  Cardiovascular: Normal rate and regular rhythm.   Pulmonary/Chest: Effort normal and breath sounds normal. No respiratory distress. She has no wheezes. She has no rales.  Abdominal: Soft. Bowel sounds are normal. She exhibits no distension. There is no tenderness. There is no rebound.  Musculoskeletal: She exhibits tenderness. She exhibits no edema.  Pain over the trapezius muscle and right shoulder, no pain in the triceps or biceps.   Neurological: She is alert and oriented to person, place, and time. Coordination normal.  Skin: Skin is warm and dry.  Psychiatric: She has a normal mood and affect.   Vitals:   06/29/17 1114  BP: (!) 160/90  Pulse:  92  Temp: 98.1 F (36.7 C)  TempSrc: Oral  SpO2: 99%  Weight: 153 lb (69.4 kg)  Height: 5' 6.5" (1.689 m)      Assessment & Plan:  Flu shot given at visit

## 2017-06-29 NOTE — Assessment & Plan Note (Signed)
Referral to PT for stretching exercises. Rx for prednisone burst therapy.

## 2017-06-29 NOTE — Patient Instructions (Signed)
We will get you in with the physical therapy to help work on the muscles in the back and shoulder.   We have sent in the prednisone to take as well. Take 2 pills daily for 5 days.    Shoulder Range of Motion Exercises Shoulder range of motion (ROM) exercises are designed to keep the shoulder moving freely. They are often recommended for people who have shoulder pain. Phase 1 exercises When you are able, do this exercise 5-6 days per week, or as told by your health care provider. Work toward doing 2 sets of 10 swings. Pendulum Exercise How To Do This Exercise Lying Down 1. Lie face-down on a bed with your abdomen close to the side of the bed. 2. Let your arm hang over the side of the bed. 3. Relax your shoulder, arm, and hand. 4. Slowly and gently swing your arm forward and back. Do not use your neck muscles to swing your arm. They should be relaxed. If you are struggling to swing your arm, have someone gently swing it for you. When you do this exercise for the first time, swing your arm at a 15 degree angle for 15 seconds, or swing your arm 10 times. As pain lessens over time, increase the angle of the swing to 30-45 degrees. 5. Repeat steps 1-4 with the other arm.  How To Do This Exercise While Standing 1. Stand next to a sturdy chair or table and hold on to it with your hand. 1. Bend forward at the waist. 2. Bend your knees slightly. 3. Relax your other arm and let it hang limp. 4. Relax the shoulder blade of the arm that is hanging and let it drop. 5. While keeping your shoulder relaxed, use body motion to swing your arm in small circles. The first time you do this exercise, swing your arm for about 30 seconds or 10 times. When you do it next time, swing your arm for a little longer. 6. Stand up tall and relax. 7. Repeat steps 1-7, this time changing the direction of the circles. 2. Repeat steps 1-8 with the other arm.  Phase 2 exercises Do these exercises 3-4 times per day on 5-6  days per week or as told by your health care provider. Work toward holding the stretch for 20 seconds. Stretching Exercise 1 1. Lift your arm straight out in front of you. 2. Bend your arm 90 degrees at the elbow (right angle) so your forearm goes across your body and looks like the letter "L." 3. Use your other arm to gently pull the elbow forward and across your body. 4. Repeat steps 1-3 with the other arm. Stretching Exercise 2 You will need a towel or rope for this exercise. 1. Bend one arm behind your back with the palm facing outward. 2. Hold a towel with your other hand. 3. Reach the arm that holds the towel above your head, and bend that arm at the elbow. Your wrist should be behind your neck. 4. Use your free hand to grab the free end of the towel. 5. With the higher hand, gently pull the towel up behind you. 6. With the lower hand, pull the towel down behind you. 7. Repeat steps 1-6 with the other arm.  Phase 3 exercises Do each of these exercises at four different times of day (sessions) every day or as told by your health care provider. To begin with, repeat each exercise 5 times (repetitions). Work toward doing 3 sets of  12 repetitions or as told by your health care provider. Strengthening Exercise 1 You will need a light weight for this activity. As you grow stronger, you may use a heavier weight. 1. Standing with a weight in your hand, lift your arm straight out to the side until it is at the same height as your shoulder. 2. Bend your arm at 90 degrees so that your fingers are pointing to the ceiling. 3. Slowly raise your hand until your arm is straight up in the air. 4. Repeat steps 1-3 with the other arm.  Strengthening Exercise 2 You will need a light weight for this activity. As you grow stronger, you may use a heavier weight. 1. Standing with a weight in your hand, gradually move your straight arm in an arc, starting at your side, then out in front of you, then straight  up over your head. 2. Gradually move your other arm in an arc, starting at your side, then out in front of you, then straight up over your head. 3. Repeat steps 1-2 with the other arm.  Strengthening Exercise 3 You will need an elastic band for this activity. As you grow stronger, gradually increase the size of the bands or increase the number of bands that you use at one time. 1. While standing, hold an elastic band in one hand and raise that arm up in the air. 2. With your other hand, pull down the band until that hand is by your side. 3. Repeat steps 1-2 with the other arm.  This information is not intended to replace advice given to you by your health care provider. Make sure you discuss any questions you have with your health care provider. Document Released: 06/04/2003 Document Revised: 05/01/2016 Document Reviewed: 09/01/2014 Elsevier Interactive Patient Education  [employer].

## 2017-06-30 ENCOUNTER — Encounter: Payer: Self-pay | Admitting: Internal Medicine

## 2017-07-10 ENCOUNTER — Ambulatory Visit: Payer: Medicare Other | Attending: Internal Medicine | Admitting: Rehabilitation

## 2017-07-10 DIAGNOSIS — M546 Pain in thoracic spine: Secondary | ICD-10-CM | POA: Diagnosis not present

## 2017-07-10 DIAGNOSIS — M25511 Pain in right shoulder: Secondary | ICD-10-CM | POA: Diagnosis present

## 2017-07-10 NOTE — Therapy (Signed)
[place], [place], [address] Phone: [phone]   Fax:  [phone]  Physical Therapy Evaluation  Patient Details  Name: Abigail Watkins MRN: [id number] Date of Birth: [birth year] Referring Provider: Pricilla Holm, MD  Encounter Date: 07/10/2017      PT End of Session - 07/10/17 1605    Visit Number 1   Number of Visits 6   Date for PT Re-Evaluation 08/21/17   Authorization Type Medicare; Gcode 10th visit, KX 15th   PT Start Time 1504   PT Stop Time 1557   PT Time Calculation (min) 53 min   Activity Tolerance Patient tolerated treatment well   Behavior During Therapy [facility] for tasks assessed/performed      Past Medical History:  Diagnosis Date  . Allergy   . Cancer [facility]) 2004   breast    Past Surgical History:  Procedure Laterality Date  . BREAST BIOPSY Left 1995  . breast biospy Right 2003  . BREAST SURGERY Right 2003  . TONSILLECTOMY AND ADENOIDECTOMY  1958  . TUBAL LIGATION  1989    There were no vitals filed for this visit.       Subjective Assessment - 07/10/17 1509    Subjective Pt presents with Rt shoulder blade pain for no known reason.  Woke up one morning with the shoulder blade region hurting.  The pain is mostly constant and gets better/worse. Has a prednisone prescription but hasn't taken it yet.  Has a constant n/t in the "whole hand".     Pertinent History Rt sided mastectomy and reconstruction around 2009, had the same thing last winter lasting x 6 weeks.     Diagnostic tests no   Patient Stated Goals decrease pain   Currently in Pain? Yes   Pain Score 4    Pain Location Scapula   Pain Orientation Right   Pain Descriptors / Indicators Aching;Burning;Constant   Pain Type Acute pain   Pain Radiating Towards Rt UE   Pain Onset More than a month ago   Pain Frequency Constant   Aggravating Factors  driving. poor sleep   Pain Relieving Factors ice   Effect of Pain on  Daily Activities don't have the stamina for activities             [facility] PT Assessment - 07/10/17 0001      Assessment   Medical Diagnosis Rt shoulder pain   Referring Provider Pricilla Holm, MD   Onset Date/Surgical Date 06/10/17   Hand Dominance Right   Prior Therapy no     Precautions   Precautions None     Restrictions   Weight Bearing Restrictions No     Balance Screen   Has the patient fallen in the past 6 months No     Prior Function   Level of Independence Independent   Vocation Full time employment   Vocation Requirements computer work; 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      Observation/Other Assessments   Focus on Therapeutic Outcomes (FOTO)  49% limited     Sensation   Light Touch Appears Intact     Posture/Postural Control   Posture/Postural Control Postural limitations   Postural Limitations Rounded Shoulders;Forward head     ROM / Strength   AROM / PROM / Strength AROM;PROM;Strength     AROM   Overall AROM Comments shoulder AROM WNL and painfree, tightness thoracic extension and rotation bil, seated thoracic extension very limited and tightness into rotation  bilaterally   AROM Assessment Site Cervical   Cervical Flexion 50   Cervical Extension 50   Cervical - Right Side Bend 35   Cervical - Left Side Bend 35   Cervical - Right Rotation 65   Cervical - Left Rotation 65     Strength   Overall Strength Comments UE myotomes strong and painfree      Palpation   Spinal mobility CPAs tspine global 2/6 mobility without increased pain   Palpation comment 1 ttp Rt rhomboids, LS            Objective measurements completed on examination: See above findings.                  PT Education - 07/10/17 1605    Education provided Yes   Education Details Diagnosis, POC, initial HEP, foam roller purchasing, posture during work   [employer]) Educated Patient   Methods Explanation;Demonstration;Handout   Comprehension Verbalized  understanding;Returned demonstration             PT Long Term Goals - 07/10/17 1612      PT LONG TERM GOAL #1   Title pt return to baseline of no Rt scapular pain   Time 6   Period Weeks   Status New   Target Date 08/21/17     PT LONG TERM GOAL #2   Title pt will improve thoracic seated extension ROM to 75% of normal values   Time 6   Period Weeks   Status New   Target Date 08/21/17     PT LONG TERM GOAL #3   Title pt will have an understanding of postural changes to make during work    Time 6   Period Weeks   Status New   Target Date 08/21/17     PT LONG TERM GOAL #4   Title Pt will be ind with final HEP   Time 6   Period Weeks   Status New   Target Date 08/21/17                Plan - 07/10/17 1606    Clinical Impression Statement pt presents with return of Rt medial/superior scapular border pain upon waking up one morning about a month ago.  Pt reports the pain is mostly constant but gets worse with working long hours at her desk and gets better with stretching and ice.  Deficits on exam today include significant thoracic stiffness into extension and moderate into rotation bilaterally, stiffness global thoracic CPAs, ttp Rt medial and superior scapular border, and postural abnormalities.  pt originally with some n/t into the Rt tricep region down the arm but now subsided with some c/o "hand tingling" in no distribution.     History and Personal Factors relevant to plan of care: Rt mastectomy and reconstruction, history of same pain lasting 6 weeks.     Clinical Presentation Evolving   Clinical Presentation due to: pain worsening lately   Clinical Decision Making Low   Rehab Potential Good   PT Frequency 1x / week   PT Duration 6 weeks   PT Treatment/Interventions Electrical Stimulation;Cryotherapy;Patient/family education;Neuromuscular re-education;Therapeutic exercise;Therapeutic activities;Manual techniques;Taping;Dry needling   PT Next Visit Plan review  HEP, manual jt mob tspine, thoracic mobility, add open book to HEP?, STM, modalities PRN needling?   PT Home Exercise Plan given initial HEP 10/22   Consulted and Agree with Plan of Care Patient      Patient will benefit from skilled therapeutic intervention in  order to improve the following deficits and impairments:  Decreased mobility, Pain, Decreased activity tolerance  Visit Diagnosis: Pain in thoracic spine  Acute pain of right shoulder      G-Codes - 2017-08-08 1614    Functional Assessment Tool Used (Outpatient Only) foto 49% limited   Functional Limitation Carrying, moving and handling objects   Carrying, Moving and Handling Objects Current Status (H2122) At least 40 percent but less than 60 percent impaired, limited or restricted   Carrying, Moving and Handling Objects Goal Status (Q8250) At least 20 percent but less than 40 percent impaired, limited or restricted       Problem List Patient Active Problem List   Diagnosis Date Noted  . Right shoulder pain 09/05/2016  . Elevated blood pressure reading 07/26/2016  . Routine general medical examination at a health care facility 04/16/2015  . History of breast cancer 10/23/2013    Stark Bray, DPT, CMP Aug 08, 2017, 4:15 PM  [facility] [address] [place], [place], [address] Phone: [phone]   Fax:  [phone]  Name: Abigail Watkins MRN: [id number] Date of Birth: [birth year]

## 2017-07-18 ENCOUNTER — Ambulatory Visit: Payer: Medicare Other | Admitting: Physical Therapy

## 2017-07-18 DIAGNOSIS — M25511 Pain in right shoulder: Secondary | ICD-10-CM

## 2017-07-18 DIAGNOSIS — M546 Pain in thoracic spine: Secondary | ICD-10-CM | POA: Diagnosis not present

## 2017-07-18 NOTE — Therapy (Signed)
[place], [place], [address] Phone: [phone]   Fax:  [phone]  Physical Therapy Treatment  Patient Details  Name: Abigail Watkins MRN: [id number] Date of Birth: [birth year] Referring Provider: Pricilla Holm, MD  Encounter Date: 07/18/2017      PT End of Session - 07/18/17 1324    Visit Number 2   Number of Visits 6   Date for PT Re-Evaluation 08/21/17   Authorization Type Medicare; Gcode 10th visit, KX 15th   PT Start Time 0305   PT Stop Time 0343   PT Time Calculation (min) 38 min      Past Medical History:  Diagnosis Date  . Allergy   . Cancer [facility]) 2004   breast    Past Surgical History:  Procedure Laterality Date  . BREAST BIOPSY Left 1995  . breast biospy Right 2003  . BREAST SURGERY Right 2003  . TONSILLECTOMY AND ADENOIDECTOMY  1958  . TUBAL LIGATION  1989    There were no vitals filed for this visit.      Subjective Assessment - 07/18/17 1507    Subjective I got a foam roller 4 days ago. I feel like the exercises are helping.    Currently in Pain? Yes   Pain Score 3    Pain Location Scapula   Pain Orientation Right   Pain Descriptors / Indicators Numbness;Throbbing   Pain Frequency Constant   Aggravating Factors  computer work    Pain Relieving Factors stretches, good posture.                          [place] Adult PT Treatment/Exercise - 07/18/17 0001      Shoulder Exercises: Standing   Extension 10 reps   Theraband Level (Shoulder Extension) Level 2 (Red)   Row 15 reps   Theraband Level (Shoulder Row) Level 3 (Green)     Shoulder Exercises: ROM/Strengthening   UBE (Upper Arm Bike) Level 1 retro x 3 minutes   Other ROM/Strengthening Exercises pec stretch on foam roller, shoulder flexion AROM on foam roller, then supine scap stab with red band on foam roller x 4 minutes    Other ROM/Strengthening Exercises thoracic extension over foam roller/  chair, book openings x 10 each     Shoulder Exercises: Stretch   Corner Stretch 3 reps;30 seconds                PT Education - 07/18/17 1542    Education provided Yes   Education Details HEP   Person(s) Educated Patient   Methods Explanation;Handout   Comprehension Verbalized understanding             PT Long Term Goals - 07/10/17 1612      PT LONG TERM GOAL #1   Title pt return to baseline of no Rt scapular pain   Time 6   Period Weeks   Status New   Target Date 08/21/17     PT LONG TERM GOAL #2   Title pt will improve thoracic seated extension ROM to 75% of normal values   Time 6   Period Weeks   Status New   Target Date 08/21/17     PT LONG TERM GOAL #3   Title pt will have an understanding of postural changes to make during work    Time 6   Period Weeks   Status New   Target Date 08/21/17  PT LONG TERM GOAL #4   Title Pt will be ind with final HEP   Time 6   Period Weeks   Status New   Target Date 08/21/17               Plan - 07/18/17 1522    Clinical Impression Statement Pt reports less pain and performing HEP. Feeling a little pain today due to computer work. She has a hard time with correcting posture. Scheduled her with PT who performs TPDN next week. Added scapular bands on foam roller and book opening stretch. Pt reports feeling looser at end of treatment.    PT Next Visit Plan TPDN upper back right, review HEP, manual jt mob tspine, thoracic mobility, add open book to HEP?, STM, modalities PRN    PT Home Exercise Plan rows, foam roller pec stretch, foam roller thoracic extension, pec stretch in doorway, supine shoulder flexion on foam roller, book opening, supine scap stab on foam roller   Consulted and Agree with Plan of Care Patient      Patient will benefit from skilled therapeutic intervention in order to improve the following deficits and impairments:  Decreased mobility, Pain, Decreased activity tolerance  Visit  Diagnosis: Pain in thoracic spine  Acute pain of right shoulder     Problem List Patient Active Problem List   Diagnosis Date Noted  . Right shoulder pain 09/05/2016  . Elevated blood pressure reading 07/26/2016  . Routine general medical examination at a health care facility 04/16/2015  . History of breast cancer 10/23/2013    Dorene Ar, PTA 07/18/2017, 4:19 PM  [facility] [address] [place], [place], [address] Phone: [phone]   Fax:  [phone]  Name: Abigail Watkins MRN: [id number] Date of Birth: 1951-06-09

## 2017-07-18 NOTE — Patient Instructions (Signed)
  Over Head Pull: Narrow Grip       On back, knees bent, feet flat, band across thighs, elbows straight but relaxed. Pull hands apart (start). Keeping elbows straight, bring arms up and over head, hands toward floor. Keep pull steady on band. Hold momentarily. Return slowly, keeping pull steady, back to start. Repeat __10-15_ times. Band color ___R___   Side Pull: Double Arm   On back, knees bent, feet flat. Arms perpendicular to body, shoulder level, elbows straight but relaxed. Pull arms out to sides, elbows straight. Resistance band comes across collarbones, hands toward floor. Hold momentarily. Slowly return to starting position. Repeat 10-15___ times. Band color _R____   Sash   On back, knees bent, feet flat, left hand on left hip, right hand above left. Pull right arm DIAGONALLY (hip to shoulder) across chest. Bring right arm along head toward floor. Hold momentarily. Slowly return to starting position. Repeat 10-15___ times. Do with left arm. Band color _R_____   Shoulder Rotation: Double Arm   On back, knees bent, feet flat, elbows tucked at sides, bent 90, hands palms up. Pull hands apart and down toward floor, keeping elbows near sides. Hold momentarily. Slowly return to starting position. Repeat _10-15__ times. Band color __R____

## 2017-07-27 ENCOUNTER — Ambulatory Visit: Payer: Medicare Other | Attending: Internal Medicine | Admitting: Physical Therapy

## 2017-07-27 ENCOUNTER — Encounter: Payer: Self-pay | Admitting: Physical Therapy

## 2017-07-27 DIAGNOSIS — M546 Pain in thoracic spine: Secondary | ICD-10-CM | POA: Diagnosis present

## 2017-07-27 DIAGNOSIS — M25511 Pain in right shoulder: Secondary | ICD-10-CM | POA: Insufficient documentation

## 2017-07-27 NOTE — Therapy (Signed)
[place], [place], [address] Phone: [phone]   Fax:  [phone]  Physical Therapy Treatment  Patient Details  Name: Abigail Watkins MRN: [id number] Date of Birth: 12/25/1950 Referring Provider: Pricilla Holm, MD   Encounter Date: 07/27/2017  PT End of Session - 07/27/17 1545    Visit Number  3    Number of Visits  6    Date for PT Re-Evaluation  08/21/17    Authorization Type  Medicare; Gcode 10th visit, KX 15th    PT Start Time  1501    PT Stop Time  1546    PT Time Calculation (min)  45 min    Activity Tolerance  Patient tolerated treatment well    Behavior During Therapy  [facility] for tasks assessed/performed       Past Medical History:  Diagnosis Date  . Allergy   . Cancer [facility]) 2004   breast    Past Surgical History:  Procedure Laterality Date  . BREAST BIOPSY Left 1995  . breast biospy Right 2003  . BREAST SURGERY Right 2003  . TONSILLECTOMY AND ADENOIDECTOMY  1958  . TUBAL LIGATION  1989    There were no vitals filed for this visit.  Subjective Assessment - 07/27/17 1506    Subjective  "the exercises are healing me, I stopped taking the prednisone because it makes me feel sore"     Currently in Pain?  Yes    Pain Score  3     Pain Orientation  Right    Pain Descriptors / Indicators  Aching    Pain Type  Chronic pain    Pain Onset  More than a month ago    Pain Frequency  Constant    Aggravating Factors   working at the computer     Pain Relieving Factors  stretches, good posture                      [facility] Adult PT Treatment/Exercise - 07/27/17 1727      Shoulder Exercises: ROM/Strengthening   UBE (Upper Arm Bike)  L1 x 4 min  changing directions at 2 min      Shoulder Exercises: Stretch   Other Shoulder Stretches  upper trap stretch 2 x 30 sec    Other Shoulder Stretches  rhomboid stretch 2 x 30 sec hold      Manual Therapy   Manual Therapy  Soft  tissue mobilization;Joint mobilization    Joint Mobilization  Grade 3 PA of the T1-T7    Soft tissue mobilization  IASTM over r upper trap/ levator scapulae and Rhomboids.        Trigger Point Dry Needling - 07/27/17 1726    Consent Given?  Yes    Education Handout Provided  Yes    Muscles Treated Upper Body  Rhomboids;Upper trapezius;Levator scapulae    Upper Trapezius Response  Twitch reponse elicited;Palpable increased muscle length    Levator Scapulae Response  Twitch response elicited;Palpable increased muscle length    Rhomboids Response  Twitch response elicited;Palpable increased muscle length           PT Education - 07/27/17 1719    Education provided  Yes    Education Details  muscle anatomy and referral patterns. What TPDN is, benefits of treatment and after care. Updated HEP     Person(s) Educated  Patient    Methods  Explanation;Verbal cues    Comprehension  Verbalized understanding;Verbal cues  required          PT Long Term Goals - 07/10/17 1612      PT LONG TERM GOAL #1   Title  pt return to baseline of no Rt scapular pain    Time  6    Period  Weeks    Status  New    Target Date  08/21/17      PT LONG TERM GOAL #2   Title  pt will improve thoracic seated extension ROM to 75% of normal values    Time  6    Period  Weeks    Status  New    Target Date  08/21/17      PT LONG TERM GOAL #3   Title  pt will have an understanding of postural changes to make during work     Time  6    Period  Weeks    Status  New    Target Date  08/21/17      PT LONG TERM GOAL #4   Title  Pt will be ind with final HEP    Time  6    Period  Weeks    Status  New    Target Date  08/21/17            Plan - 07/27/17 1721    Clinical Impression Statement  pt reported 3/10 pain today. educated on DN and performed Rhomboid, levator scapulae, and upper trap. Followed DN with soft tissue techniques and mobs which she reported decreased pain and tightness. she was  able to do all exercises with no report of pain and declined modalities.     Rehab Potential  Good    PT Next Visit Plan  How was DN, review HEP, manual jt mob tspine, thoracic mobility, add open book to HEP?, STM, modalities PRN     PT Home Exercise Plan  rows, foam roller pec stretch, foam roller thoracic extension, pec stretch in doorway, supine shoulder flexion on foam roller, book opening, supine scap stab on foam roller    Consulted and Agree with Plan of Care  Patient       Patient will benefit from skilled therapeutic intervention in order to improve the following deficits and impairments:  Decreased mobility, Pain, Decreased activity tolerance  Visit Diagnosis: Pain in thoracic spine  Acute pain of right shoulder     Problem List Patient Active Problem List   Diagnosis Date Noted  . Right shoulder pain 09/05/2016  . Elevated blood pressure reading 07/26/2016  . Routine general medical examination at a health care facility 04/16/2015  . History of breast cancer 10/23/2013   Starr Lake PT, DPT, LAT, ATC  07/27/17  5:36 PM      [place] [facility] [address] [place], [place], [address] Phone: [phone]   Fax:  [phone]  Name: Maryela Tapper MRN: [id number] Date of Birth: 02/25/1951

## 2017-08-01 ENCOUNTER — Ambulatory Visit: Payer: Medicare Other | Admitting: Physical Therapy

## 2017-08-01 ENCOUNTER — Encounter: Payer: Self-pay | Admitting: Physical Therapy

## 2017-08-01 DIAGNOSIS — M546 Pain in thoracic spine: Secondary | ICD-10-CM

## 2017-08-01 DIAGNOSIS — M25511 Pain in right shoulder: Secondary | ICD-10-CM

## 2017-08-01 NOTE — Therapy (Signed)
[place], [place], [address] Phone: [phone]   Fax:  [phone]  Physical Therapy Treatment  Patient Details  Name: Abigail Watkins MRN: [id number] Date of Birth: March 24, 1951 Referring Provider: Pricilla Holm, MD   Encounter Date: 08/01/2017  PT End of Session - 08/01/17 1740    Visit Number  4    Number of Visits  6    Date for PT Re-Evaluation  08/21/17    Authorization Type  Medicare; Gcode 10th visit, KX 15th    PT Start Time  1501    PT Stop Time  1546    PT Time Calculation (min)  45 min    Activity Tolerance  Patient tolerated treatment well    Behavior During Therapy  [facility] for tasks assessed/performed       Past Medical History:  Diagnosis Date  . Allergy   . Cancer [facility]) 2004   breast    Past Surgical History:  Procedure Laterality Date  . BREAST BIOPSY Left 1995  . breast biospy Right 2003  . BREAST SURGERY Right 2003  . TONSILLECTOMY AND ADENOIDECTOMY  1958  . TUBAL LIGATION  1989    There were no vitals filed for this visit.  Subjective Assessment - 08/01/17 1508    Subjective  "ive done pretty well, I am feeling better"     Currently in Pain?  Yes    Pain Score  3     Pain Location  Scapula    Pain Orientation  Right    Pain Descriptors / Indicators  Aching    Pain Type  Chronic pain    Pain Onset  More than a month ago    Pain Frequency  Intermittent    Aggravating Factors   working at the computer    Pain Relieving Factors  stretches                      [facility] Adult PT Treatment/Exercise - 08/01/17 1739      Shoulder Exercises: Seated   Other Seated Exercises  lower trap strengthening 2 x 10 with bent elbows on bolster and going into ER       Shoulder Exercises: ROM/Strengthening   UBE (Upper Arm Bike)  L1 x 4 min  changing direciton at 2 min      Shoulder Exercises: Stretch   Other Shoulder Stretches  upper trap stretch 2 x 30 sec      Manual Therapy   Joint Mobilization  Grade 3 PA of the T1-T7    Soft tissue mobilization  IASTM over r upper trap/ levator scapulae and Rhomboids.        Trigger Point Dry Needling - 08/01/17 1737    Consent Given?  Yes    Education Handout Provided  -- given previously    Muscles Treated Upper Body  -- Latissimus Dorsi    Upper Trapezius Response  Palpable increased muscle length;Twitch reponse elicited    Levator Scapulae Response  Twitch response elicited;Palpable increased muscle length    Rhomboids Response  Twitch response elicited;Palpable increased muscle length                PT Long Term Goals - 07/10/17 1612      PT LONG TERM GOAL #1   Title  pt return to baseline of no Rt scapular pain    Time  6    Period  Weeks    Status  New  Target Date  08/21/17      PT LONG TERM GOAL #2   Title  pt will improve thoracic seated extension ROM to 75% of normal values    Time  6    Period  Weeks    Status  New    Target Date  08/21/17      PT LONG TERM GOAL #3   Title  pt will have an understanding of postural changes to make during work     Time  6    Period  Weeks    Status  New    Target Date  08/21/17      PT LONG TERM GOAL #4   Title  Pt will be ind with final HEP    Time  6    Period  Weeks    Status  New    Target Date  08/21/17            Plan - 08/01/17 1740    Clinical Impression Statement  2/10 pain reported today and some soreness noted following last session.  Continued TPDN over R rhomboids, upper trap, latissumus Dorsi, and levator scapulae. decreased soreness following manual techniques and strengthening. upgraded HEP for seated pelvic. she declined modalities post session.     PT Next Visit Plan  How was DN, review HEP, thoracic mobility add open book to HEP, STM, modalities PRN , scapular stab, posture and core strengthening.     PT Home Exercise Plan  rows, foam roller pec stretch, foam roller thoracic extension, pec stretch in  doorway, supine shoulder flexion on foam roller, book opening, supine scap stab on foam roller    Consulted and Agree with Plan of Care  Patient       Patient will benefit from skilled therapeutic intervention in order to improve the following deficits and impairments:     Visit Diagnosis: Pain in thoracic spine  Acute pain of right shoulder     Problem List Patient Active Problem List   Diagnosis Date Noted  . Right shoulder pain 09/05/2016  . Elevated blood pressure reading 07/26/2016  . Routine general medical examination at a health care facility 04/16/2015  . History of breast cancer 10/23/2013    Starr Lake PT, DPT, LAT, ATC  08/01/17  5:44 PM      [facility] [address] [place], [place], [address] Phone: [phone]   Fax:  [phone]  Name: Lada Fulbright MRN: [id number] Date of Birth: 10/19/1950

## 2017-08-08 ENCOUNTER — Ambulatory Visit: Payer: Medicare Other | Admitting: Physical Therapy

## 2017-08-08 ENCOUNTER — Encounter: Payer: Self-pay | Admitting: Physical Therapy

## 2017-08-08 DIAGNOSIS — M25511 Pain in right shoulder: Secondary | ICD-10-CM

## 2017-08-08 DIAGNOSIS — M546 Pain in thoracic spine: Secondary | ICD-10-CM

## 2017-08-08 NOTE — Therapy (Signed)
[place], [place], [address] Phone: [phone]   Fax:  [phone]  Physical Therapy Treatment  Patient Details  Name: Abigail Watkins MRN: [id number] Date of Birth: 02-02-1951 Referring Provider: Pricilla Holm, MD   Encounter Date: 08/08/2017  PT End of Session - 08/08/17 1512    Visit Number  5    Number of Visits  6    Date for PT Re-Evaluation  08/21/17    Authorization Type  Medicare; Gcode 10th visit, KX 15th    PT Start Time  0306    PT Stop Time  0352    PT Time Calculation (min)  46 min       Past Medical History:  Diagnosis Date  . Allergy   . Cancer [facility]) 2004   breast    Past Surgical History:  Procedure Laterality Date  . BREAST BIOPSY Left 1995  . breast biospy Right 2003  . BREAST SURGERY Right 2003  . TONSILLECTOMY AND ADENOIDECTOMY  1958  . TUBAL LIGATION  1989    There were no vitals filed for this visit.  Subjective Assessment - 08/08/17 1511    Subjective  Pain is more intermittent in shoulder blade. No pain now.  Noticed lower back pain that started 4 days ago.     Currently in Pain?  No/denies    Aggravating Factors   too much computer work, more than an hour     Pain Relieving Factors  stretches          [facility] PT Assessment - 08/08/17 0001      Observation/Other Assessments   Focus on Therapeutic Outcomes (FOTO)   23% limited                   [facility] Adult PT Treatment/Exercise - 08/08/17 0001      Self-Care   Self-Care  ADL's;Posture;Lifting      Lumbar Exercises: Stretches   Single Knee to Chest Stretch  30 seconds;2 reps    Pelvic Tilt  10 seconds    Pelvic Tilt Limitations  5 seconds       Shoulder Exercises: Supine   Other Supine Exercises  horizontal abduction 2 x 10 with red band (cues for slow eccentric movement), scapular retraction with ER 2 x 10 with red band,       Shoulder Exercises: Seated   Other Seated Exercises  hip  hinge for lifting x 10 (sit to stand)       Shoulder Exercises: ROM/Strengthening   Other ROM/Strengthening Exercises  standing horizontal abduction and ER red verses yellow              PT Education - 08/08/17 1557    Education provided  Yes    Education Details  HEP    Person(s) Educated  Patient    Methods  Explanation    Comprehension  Verbalized understanding          PT Long Term Goals - 08/08/17 1554      PT LONG TERM GOAL #1   Title  pt return to baseline of no Rt scapular pain    Baseline  70% improved     Time  6    Period  Weeks    Status  Partially Met      PT LONG TERM GOAL #2   Title  pt will improve thoracic seated extension ROM to 75% of normal values    Time  6  Period  Weeks    Status  Unable to assess      PT LONG TERM GOAL #3   Title  pt will have an understanding of postural changes to make during work     Time  6    Period  Weeks    Status  Achieved      PT LONG TERM GOAL #4   Title  Pt will be ind with final HEP    Baseline  indpependent thus far.     Time  6    Period  Weeks    Status  Partially Met            Plan - 08/08/17 1517    Clinical Impression Statement  Pt reports 70% improvement in right periscapular pain. Today she c/o lumbar pain and asks for exercise suggestions. 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 Educated pt on posture and body mechanics with sitting, lifting and ADLs, Practiced hip hinge with pt requiring mod cues. Pt is schedule for one more visit prior to end of POC. She may choose to cancel if she does not think she needs the last appointment. Overall she is much improved. She may be interested in getting a Lumbar referral.     PT Next Visit Plan  review and DC if pt doing well, otherwise shcedule ERO with Kris     PT Home Exercise Plan  rows green band , foam roller pec stretch, foam roller thoracic extension, pec stretch in doorway, supine shoulder  flexion on foam roller, book opening, supine scap stab on foam roller, red band horizontal abduction, ER     Consulted and Agree with Plan of Care  Patient       Patient will benefit from skilled therapeutic intervention in order to improve the following deficits and impairments:  Decreased mobility, Pain, Decreased activity tolerance  Visit Diagnosis: Pain in thoracic spine  Acute pain of right shoulder     Problem List Patient Active Problem List   Diagnosis Date Noted  . Right shoulder pain 09/05/2016  . Elevated blood pressure reading 07/26/2016  . Routine general medical examination at a health care facility 04/16/2015  . History of breast cancer 10/23/2013    ,  [place], PTA 08/08/2017, 3:58 PM  [facility] [address] [place], [place], [address] Phone: [phone]   Fax:  [phone]  Name: Abigail Watkins MRN: [id number] Date of Birth: 04/22/1951   

## 2017-08-16 ENCOUNTER — Encounter: Payer: Self-pay | Admitting: Physical Therapy

## 2017-08-16 ENCOUNTER — Ambulatory Visit: Payer: Medicare Other | Admitting: Physical Therapy

## 2017-08-16 DIAGNOSIS — M546 Pain in thoracic spine: Secondary | ICD-10-CM

## 2017-08-16 DIAGNOSIS — M25511 Pain in right shoulder: Secondary | ICD-10-CM

## 2017-08-16 NOTE — Patient Instructions (Addendum)

## 2017-08-16 NOTE — Therapy (Addendum)
[place], [place], [address] Phone: [phone]   Fax:  [phone]  Physical Therapy Treatment / Discharge Summary  Patient Details  Name: Talesha Ellithorpe MRN: [id number] Date of Birth: 10-27-1950 Referring Provider: Pricilla Holm, MD   Encounter Date: 08/16/2017  PT End of Session - 08/16/17 1628    Visit Number  6    Number of Visits  6    Date for PT Re-Evaluation  08/21/17    PT Start Time  0174    PT Stop Time  1628    PT Time Calculation (min)  39 min    Activity Tolerance  Patient tolerated treatment well    Behavior During Therapy  [facility] for tasks assessed/performed       Past Medical History:  Diagnosis Date  . Allergy   . Cancer [facility]) 2004   breast    Past Surgical History:  Procedure Laterality Date  . BREAST BIOPSY Left 1995  . breast biospy Right 2003  . BREAST SURGERY Right 2003  . TONSILLECTOMY AND ADENOIDECTOMY  1958  . TUBAL LIGATION  1989    There were no vitals filed for this visit.  Subjective Assessment - 08/16/17 1552    Subjective  I just want to get a few more exercises for strengthening.                        [facility] Adult PT Treatment/Exercise - 08/16/17 0001      Self-Care   Self-Care  ADL's;Lifting;Posture    ADL's  Handout reviewed    Lifting  form practiced    Posture  stand, sit pole used      Lumbar Exercises: Seated   Sit to Stand Limitations  Hip hinge multiple reps.  pole uses for correct posture.  Diffivcult however patient felt that when she did correctly her legs were doing all the work.        Shoulder Exercises: Prone   Other Prone Exercises  T, Y's thumbs in 2 different positions,  HEP      Shoulder Exercises: ROM/Strengthening   Other ROM/Strengthening Exercises  horizontal abd, diagonals,  extension   10 x ,  back to wall for  all except shoulder sxtension             PT Education - 08/16/17 1628    Education  provided  Yes    Education Details  self care,  HEP    Person(s) Educated  Patient    Methods  Explanation;Demonstration;Tactile cues;Handout;Verbal cues    Comprehension  Verbalized understanding;Returned demonstration          PT Long Term Goals - 08/16/17 1633      PT LONG TERM GOAL #1   Title  pt return to baseline of no Rt scapular pain    Baseline  almost 100%    Time  6    Period  Weeks    Status  Achieved      PT LONG TERM GOAL #2   Title  pt will improve thoracic seated extension ROM to 75% of normal values    Baseline  able    Time  6    Period  Weeks    Status  Achieved      PT LONG TERM GOAL #3   Title  pt will have an understanding of postural changes to make during work     Baseline  able  Time  6    Period  Weeks    Status  Achieved      PT LONG TERM GOAL #4   Title  Pt will be ind with final HEP    Baseline  independent    Time  6    Status  Achieved          G-Codes - 08-26-2017 3704    Functional Assessment Tool Used (Outpatient Only)  FOTO/ clinical judgement    Functional Limitation  Carrying, moving and handling objects    Carrying, Moving and Handling Objects Goal Status (U8891)  At least 20 percent but less than 40 percent impaired, limited or restricted    Carrying, Moving and Handling Objects Discharge Status [phone])  At least 20 percent but less than 40 percent impaired, limited or restricted          Plan - 2017-08-26 1629    Clinical Impression Statement  Patient notes she is almost 100% inproved.  She has modified sitting ,  standing posture to decrease pain.  She has silver sneakers and plans to return to the [facility] for continued ex post PT.  No pain at end of session.  All goals met.  Thoracic extension improved with hip hinge practice with pole and with prone [place] exercises  T, Y's.  It was at least 75% WNL in sitting.    PT Next Visit Plan  D/C to HEP and Silver Sneakers.      PT Home Exercise Plan  rows green band , foam  roller pec stretch, foam roller thoracic extension, pec stretch in doorway, supine shoulder flexion on foam roller, book opening, supine scap stab on foam roller, red band horizontal abduction, ER Standing scapular band unattached,  [place].    Consulted and Agree with Plan of Care  Patient       Patient will benefit from skilled therapeutic intervention in order to improve the following deficits and impairments:     Visit Diagnosis: Pain in thoracic spine  Acute pain of right shoulder     Problem List Patient Active Problem List   Diagnosis Date Noted  . Right shoulder pain 09/05/2016  . Elevated blood pressure reading 07/26/2016  . Routine general medical examination at a health care facility 04/16/2015  . History of breast cancer 10/23/2013    [facility]  PTA 26-Aug-2017, 6:09 PM  [facility] [address] [place], [place], [address] Phone: [phone]   Fax:  [phone]  Name: Icyss Skog MRN: [id number] Date of Birth: 02-Nov-1950     PHYSICAL THERAPY DISCHARGE SUMMARY  Visits from Start of Care: 6  Current functional level related to goals / functional outcomes: See goals   Remaining deficits: See above assessment   Education / Equipment: HEP, posture and lifting mechanics  Plan: Patient agrees to discharge.  Patient goals were met. Patient is being discharged due to meeting the stated rehab goals.  ?????     Kristoffer Leamon PT, DPT, LAT, ATC  08/17/17  8:12 AM

## 2017-08-22 ENCOUNTER — Encounter: Payer: Self-pay | Admitting: Physical Therapy

## 2017-08-29 ENCOUNTER — Telehealth: Payer: Medicare Other | Admitting: Nurse Practitioner

## 2017-08-29 DIAGNOSIS — J0100 Acute maxillary sinusitis, unspecified: Secondary | ICD-10-CM

## 2017-08-29 MED ORDER — AMOXICILLIN-POT CLAVULANATE 875-125 MG PO TABS: 1.0000 | ORAL_TABLET | Freq: Two times a day (BID) | ORAL | 0 refills | Status: DC

## 2017-08-29 NOTE — Progress Notes (Signed)

## 2017-09-21 ENCOUNTER — Other Ambulatory Visit: Payer: Self-pay | Admitting: Internal Medicine

## 2017-09-21 DIAGNOSIS — Z139 Encounter for screening, unspecified: Secondary | ICD-10-CM

## 2017-09-22 ENCOUNTER — Ambulatory Visit
Admission: RE | Admit: 2017-09-22 | Discharge: 2017-09-22 | Disposition: A | Discharge status: Home / Self Care | Payer: Medicare Other | Source: Ambulatory Visit | Attending: Internal Medicine | Admitting: Internal Medicine

## 2017-09-22 DIAGNOSIS — Z139 Encounter for screening, unspecified: Secondary | ICD-10-CM

## 2017-09-22 HISTORY — DX: Malignant neoplasm of unspecified site of unspecified female breast: C50.919

## 2018-01-21 IMAGING — MG DIGITAL SCREENING UNILATERAL LEFT MAMMOGRAM WITH CAD AND ADJUNCT
6 series · 6 of 14 positions shown · non-contrast
Comparison: Previous exam(s).

CLINICAL DATA: Screening.

EXAM:
DIGITAL SCREENING UNILATERAL LEFT MAMMOGRAM WITH CAD AND ADJUNCT
TOMO

[L MLO]
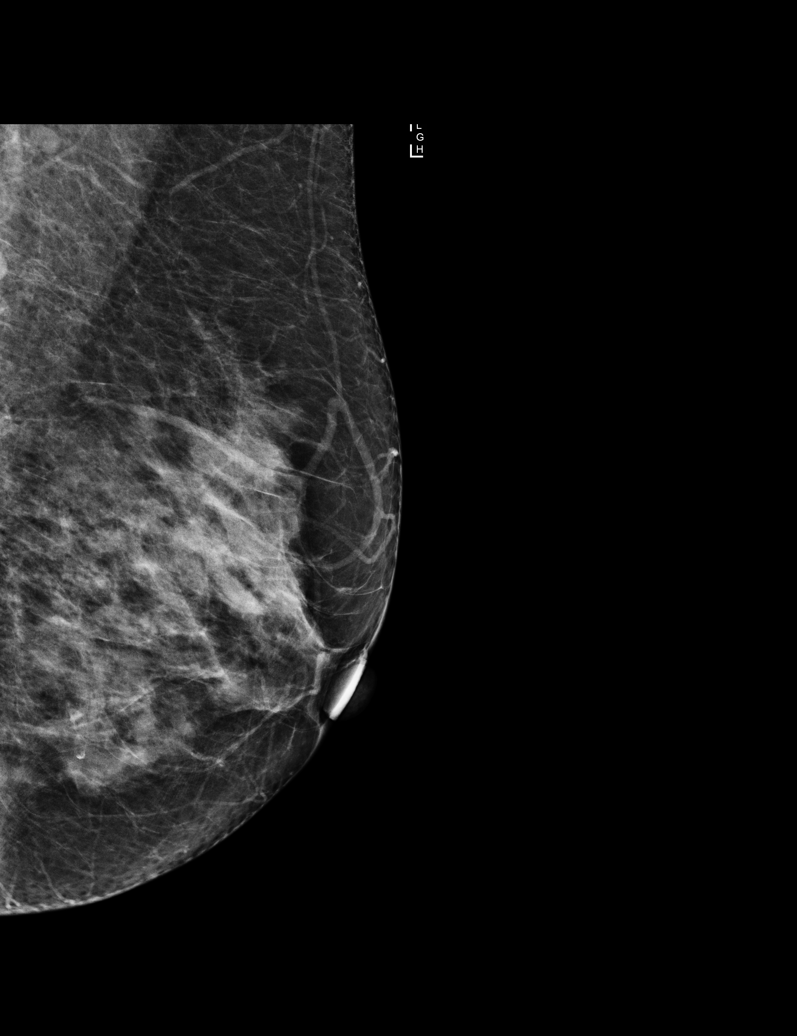

[L MLO synth-2D]
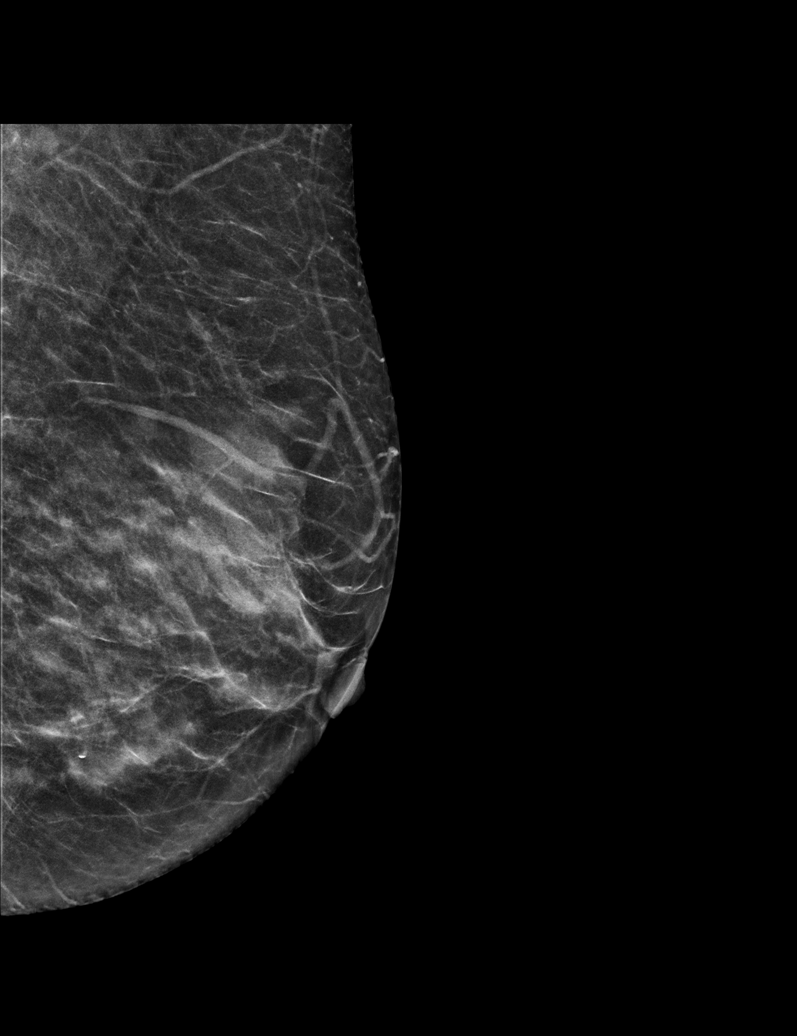

[L CC]
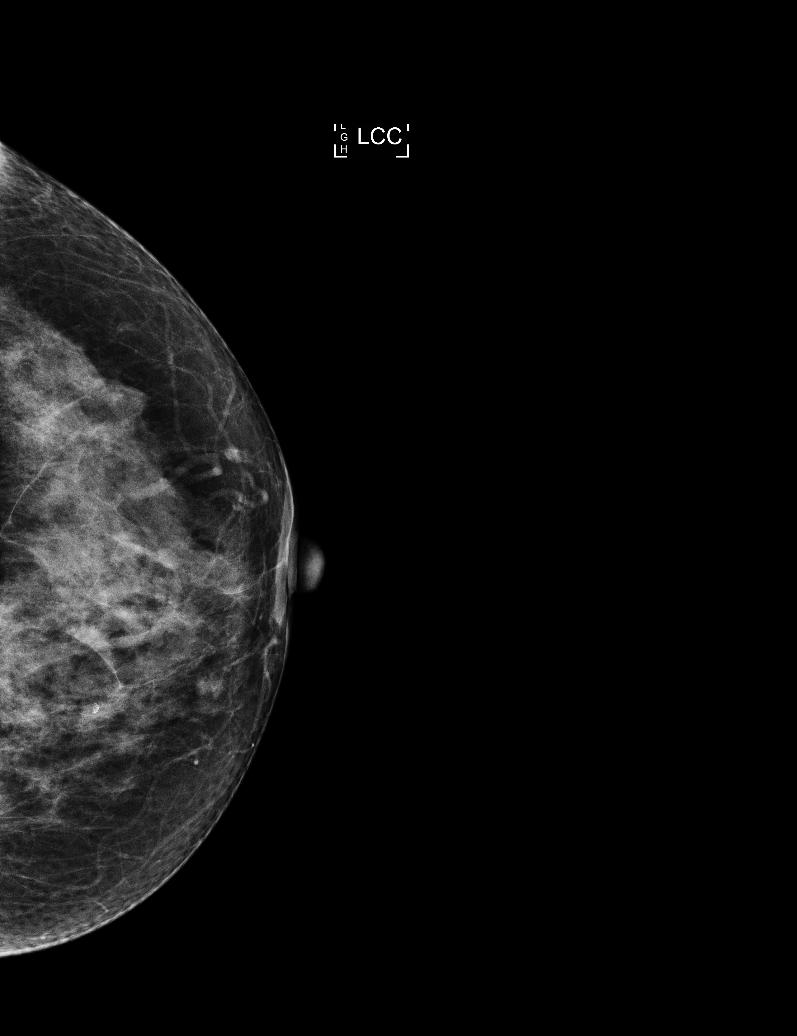

[L CC synth-2D]
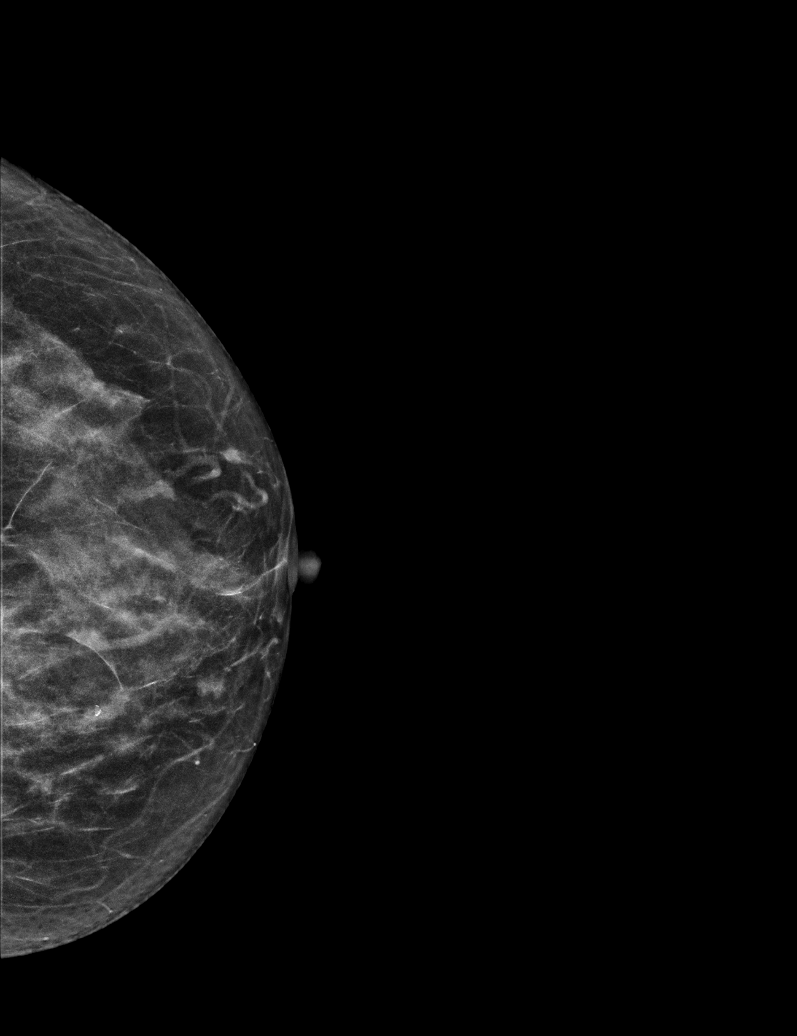

[L MLO tomo · tomo slice 25/50.0]
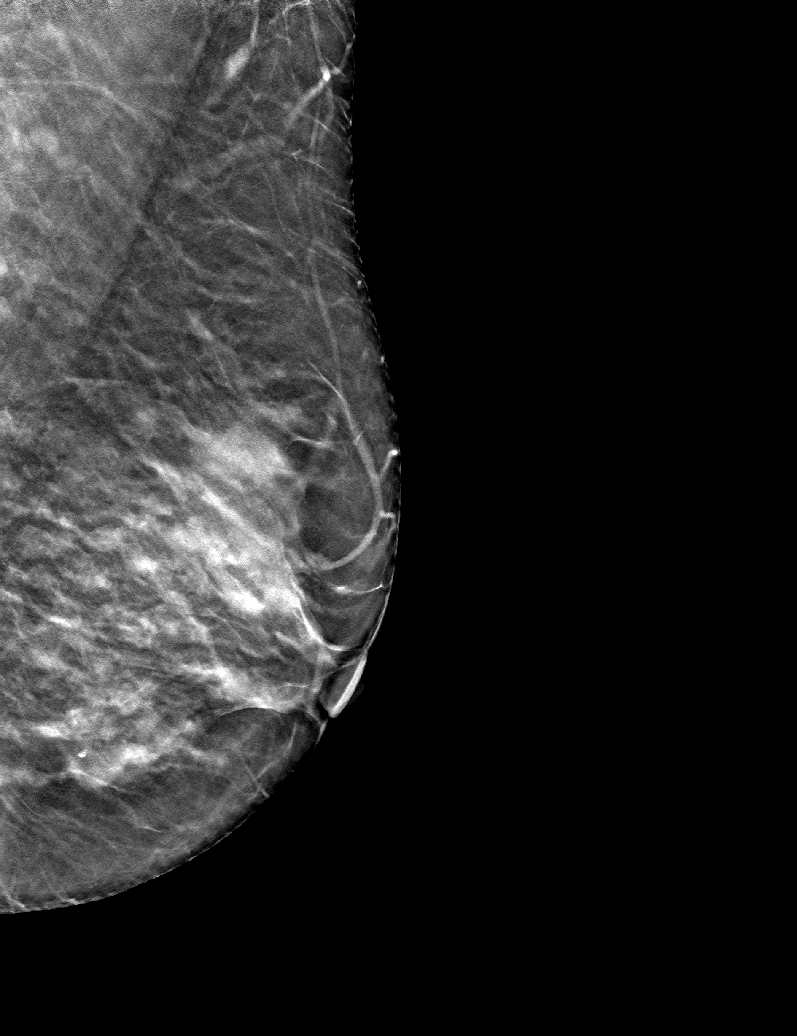

[L CC tomo · tomo slice 25/49.0]
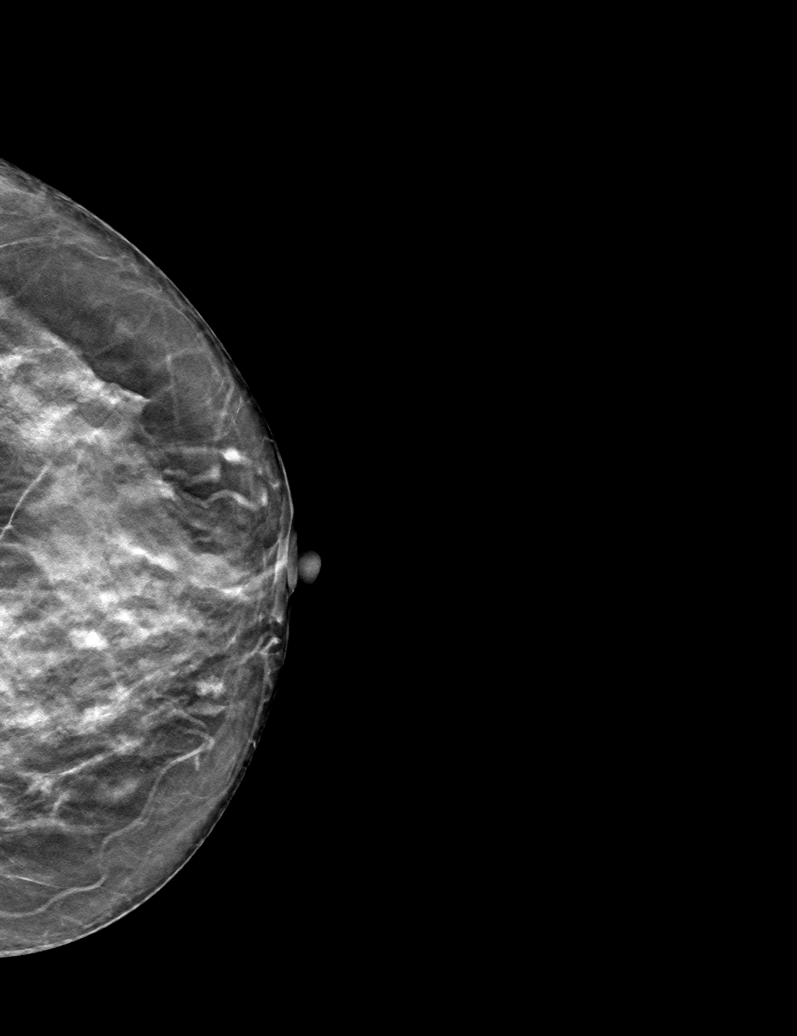

[6 of 14 positions shown; findings below may reference images not displayed]

ACR Breast Density Category c: The breast tissue is heterogeneously
dense, which may obscure small masses.
FINDINGS: The patient has had a right mastectomy. There are no findings
suspicious for malignancy.

Images were processed with CAD.
IMPRESSION: No mammographic evidence of malignancy. A result letter of this
screening mammogram will be mailed directly to the patient.

RECOMMENDATION:
Screening mammogram in one year.  (Code:MJ-2-DTJ)

BI-RADS CATEGORY  1: Negative.

## 2018-07-26 ENCOUNTER — Other Ambulatory Visit: Payer: Self-pay | Admitting: Internal Medicine

## 2018-07-26 DIAGNOSIS — Z1231 Encounter for screening mammogram for malignant neoplasm of breast: Secondary | ICD-10-CM

## 2018-08-07 ENCOUNTER — Ambulatory Visit (INDEPENDENT_AMBULATORY_CARE_PROVIDER_SITE_OTHER): Payer: Medicare Other | Admitting: Internal Medicine

## 2018-08-07 ENCOUNTER — Other Ambulatory Visit (INDEPENDENT_AMBULATORY_CARE_PROVIDER_SITE_OTHER): Payer: Medicare Other

## 2018-08-07 ENCOUNTER — Encounter: Payer: Self-pay | Admitting: Internal Medicine

## 2018-08-07 VITALS — BP 138/82 | HR 81 | Temp 97.6°F | Ht 66.5 in | Wt 156.0 lb

## 2018-08-07 DIAGNOSIS — Z853 Personal history of malignant neoplasm of breast: Secondary | ICD-10-CM

## 2018-08-07 DIAGNOSIS — Z0000 Encounter for general adult medical examination without abnormal findings: Secondary | ICD-10-CM | POA: Diagnosis not present

## 2018-08-07 DIAGNOSIS — Z23 Encounter for immunization: Secondary | ICD-10-CM

## 2018-08-07 LAB — CBC
HEMATOCRIT: 42.9 % (ref 36.0–46.0)
Hemoglobin: 14.8 g/dL (ref 12.0–15.0)
MCHC: 34.4 g/dL (ref 30.0–36.0)
MCV: 94.2 fl (ref 78.0–100.0)
PLATELETS: 355 10*3/uL (ref 150.0–400.0)
RBC: 4.55 Mil/uL (ref 3.87–5.11)
RDW: 13 % (ref 11.5–15.5)
WBC: 6.9 10*3/uL (ref 4.0–10.5)

## 2018-08-07 LAB — COMPREHENSIVE METABOLIC PANEL
ALK PHOS: 61 U/L (ref 39–117)
ALT: 13 U/L (ref 0–35)
AST: 15 U/L (ref 0–37)
Albumin: 4.3 g/dL (ref 3.5–5.2)
BILIRUBIN TOTAL: 0.7 mg/dL (ref 0.2–1.2)
BUN: 15 mg/dL (ref 6–23)
CALCIUM: 9.4 mg/dL (ref 8.4–10.5)
CO2: 28 meq/L (ref 19–32)
Chloride: 102 mEq/L (ref 96–112)
Creatinine, Ser: 0.74 mg/dL (ref 0.40–1.20)
GFR: 82.67 mL/min (ref >60.00)
Glucose, Bld: 102 mg/dL — ABNORMAL HIGH (ref 70–99)
POTASSIUM: 4 meq/L (ref 3.5–5.1)
Sodium: 140 mEq/L (ref 135–145)
TOTAL PROTEIN: 7.1 g/dL (ref 6.0–8.3)

## 2018-08-07 LAB — LIPID PANEL
CHOL/HDL RATIO: 2
Cholesterol: 164 mg/dL (ref 0–200)
HDL: 66.9 mg/dL (ref >39.00)
LDL Cholesterol: 85 mg/dL (ref 0–99)
NonHDL: 97.21
TRIGLYCERIDES: 63 mg/dL (ref 0.0–149.0)
VLDL: 12.6 mg/dL (ref 0.0–40.0)

## 2018-08-07 LAB — HEMOGLOBIN A1C: HEMOGLOBIN A1C: 5.5 % (ref 4.6–6.5)

## 2018-08-07 LAB — TSH: TSH: 0.74 u[IU]/mL (ref 0.35–4.50)

## 2018-08-07 LAB — VITAMIN B12: Vitamin B-12: 468 pg/mL (ref 211–911)

## 2018-08-07 MED ORDER — TRIAMCINOLONE ACETONIDE 0.1 % EX CREA: 1.0000 "application " | TOPICAL_CREAM | Freq: Two times a day (BID) | CUTANEOUS | 0 refills | Status: DC

## 2018-08-07 NOTE — Patient Instructions (Signed)

## 2018-08-07 NOTE — Assessment & Plan Note (Signed)
Mammogram yearly, most recent normal.

## 2018-08-07 NOTE — Assessment & Plan Note (Signed)
Flu shot given. Pneumonia given 23 to complete series. Shingrix counseled. Tetanus up to date. Colonoscopy up to date. Mammogram up to date, pap smear aged out and dexa up to date. Counseled about sun safety and mole surveillance. Counseled about the dangers of distracted driving. Given 10 year screening recommendations.

## 2018-08-07 NOTE — Progress Notes (Signed)
   Subjective:    Patient ID: Abigail Watkins, female    DOB: 1951/05/01, 67 y.o.   MRN: [id number]  HPI Here for medicare wellness and physical, no new complaints. Please see A/P for status and treatment of chronic medical problems.   Diet: heart healthy Physical activity: active walks daily Depression/mood screen: negative Hearing: intact to whispered voice, mild loss bilateral Visual acuity: grossly normal with lens, performs annual eye exam  ADLs: capable Fall risk: none Home safety: good Cognitive evaluation: intact to orientation, naming, recall and repetition EOL planning: adv directives discussed  I have personally reviewed and have noted 1. The patient's medical and social history - reviewed today no changes 2. Their use of alcohol, tobacco or illicit drugs 3. Their current medications and supplements 4. The patient's functional ability including ADL's, fall risks, home safety risks and hearing or visual impairment. 5. Diet and physical activities 6. Evidence for depression or mood disorders 7. Care team reviewed and updated (available in snapshot)  Review of Systems  Constitutional: Negative.   HENT: Negative.   Eyes: Negative.   Respiratory: Negative for cough, chest tightness and shortness of breath.   Cardiovascular: Negative for chest pain, palpitations and leg swelling.  Gastrointestinal: Negative for abdominal distention, abdominal pain, constipation, diarrhea, nausea and vomiting.  Musculoskeletal: Negative.   Skin: Negative.   Neurological: Negative.   Psychiatric/Behavioral: Negative.       Objective:   Physical Exam  Constitutional: She is oriented to person, place, and time. She appears well-developed and well-nourished.  HENT:  Head: Normocephalic and atraumatic.  Eyes: EOM are normal.  Neck: Normal range of motion.  Cardiovascular: Normal rate and regular rhythm.  Pulmonary/Chest: Effort normal and breath sounds normal. No respiratory distress. She  has no wheezes. She has no rales.  Abdominal: Soft. Bowel sounds are normal. She exhibits no distension. There is no tenderness. There is no rebound.  Musculoskeletal: She exhibits no edema.  Neurological: She is alert and oriented to person, place, and time. Coordination normal.  Skin: Skin is warm and dry.  Psychiatric: She has a normal mood and affect.   Vitals:   08/07/18 1049  BP: 138/82  Pulse: 81  Temp: 97.6 F (36.4 C)  TempSrc: Oral  SpO2: 99%  Weight: 156 lb (70.8 kg)  Height: 5' 6.5" (1.689 m)      Assessment & Plan:  Flu and pneumonia 23 given at visit

## 2018-09-25 ENCOUNTER — Ambulatory Visit
Admission: RE | Admit: 2018-09-25 | Discharge: 2018-09-25 | Disposition: A | Discharge status: Home / Self Care | Payer: Medicare Other | Source: Ambulatory Visit | Attending: Internal Medicine | Admitting: Internal Medicine

## 2018-09-25 DIAGNOSIS — Z1231 Encounter for screening mammogram for malignant neoplasm of breast: Secondary | ICD-10-CM

## 2019-01-08 ENCOUNTER — Encounter: Payer: Self-pay | Admitting: Internal Medicine

## 2019-09-04 NOTE — Progress Notes (Signed)
Subjective:   Abigail Watkins is a 68 y.o. female who presents for Medicare Annual (Subsequent) preventive examination.  This visit occurred during the SARS-CoV-2 public health emergency.  Safety protocols were in place, including screening questions prior to the visit, additional usage of staff PPE, and extensive cleaning of exam room while observing appropriate contact time as indicated for disinfecting solutions.   Review of Systems:   Cardiac Risk Factors include: advanced age (>51men, >61 women) Sleep patterns: feels rested on waking, gets up 0 times nightly to void and sleeps 7-8 hours nightly.    Home Safety/Smoke Alarms: Feels safe in home. Smoke alarms in place.  Living environment; residence and Firearm Safety: 2-story house. Lives with husband, no needs for DME, good support system Seat Belt Safety/Bike Helmet: Wears seat belt.     Objective:     Vitals: There were no vitals taken for this visit.  There is no height or weight on file to calculate BMI.  Advanced Directives 09/05/2019 07/10/2017  Does Patient Have a Medical Advance Directive? Yes Yes  Type of Advance Directive Oliver in Chart? No - copy requested -    Tobacco Social History   Tobacco Use  Smoking Status Former Smoker  . Quit date: 09/19/1978  . Years since quitting: 40.9  Smokeless Tobacco Never Used     Counseling given: Not Answered  Past Medical History:  Diagnosis Date  . Allergy   . Breast cancer ([place])   . Cancer [facility]) 2004   breast   Past Surgical History:  Procedure Laterality Date  . BREAST BIOPSY Left 1995  . breast biospy Right 2003  . BREAST SURGERY Right 2003  . MASTECTOMY    . TONSILLECTOMY AND ADENOIDECTOMY  1958  . TUBAL LIGATION  1989   Family History  Problem Relation Age of Onset  . Cancer Maternal Grandmother   . Breast cancer Paternal Grandmother   . Colon cancer Neg Hx    Social History    Socioeconomic History  . Marital status: Married    Spouse name: Not on file  . Number of children: 2  . Years of education: Not on file  . Highest education level: Not on file  Occupational History  . Occupation: Retired  Tobacco Use  . Smoking status: Former Smoker    Quit date: 09/19/1978    Years since quitting: 40.9  . Smokeless tobacco: Never Used  Substance and Sexual Activity  . Alcohol use: Yes    Alcohol/week: 5.0 standard drinks    Types: 5 Glasses of Cheyenne Schumm per week  . Drug use: No  . Sexual activity: Yes  Other Topics Concern  . Not on file  Social History Narrative  . Not on file   Social Determinants of Health   Financial Resource Strain:   . Difficulty of Paying Living Expenses: Not on file  Food Insecurity:   . Worried About Charity fundraiser in the Last Year: Not on file  . Ran Out of Food in the Last Year: Not on file  Transportation Needs:   . Lack of Transportation (Medical): Not on file  . Lack of Transportation (Non-Medical): Not on file  Physical Activity:   . Days of Exercise per Week: Not on file  . Minutes of Exercise per Session: Not on file  Stress:   . Feeling of Stress : Not on file  Social Connections:   . Frequency of Communication  with Friends and Family: Not on file  . Frequency of Social Gatherings with Friends and Family: Not on file  . Attends Religious Services: Not on file  . Active Member of Clubs or Organizations: Not on file  . Attends Archivist Meetings: Not on file  . Marital Status: Not on file    Outpatient Encounter Medications as of 09/05/2019  Medication Sig  . [DISCONTINUED] triamcinolone cream (KENALOG) 0.1 % Apply 1 application topically 2 (two) times daily.   No facility-administered encounter medications on file as of 09/05/2019.    Activities of Daily Living In your present state of health, do you have any difficulty performing the following activities: 09/05/2019  Hearing? N  Vision? N   Difficulty concentrating or making decisions? N  Walking or climbing stairs? N  Dressing or bathing? N  Doing errands, shopping? N  Preparing Food and eating ? N  Using the Toilet? N  In the past six months, have you accidently leaked urine? N  Do you have problems with loss of bowel control? N  Managing your Medications? N  Managing your Finances? N  Housekeeping or managing your Housekeeping? N  Some recent data might be hidden    Patient Care Team: Hoyt Koch, MD as PCP - General (Internal Medicine)    Assessment:   This is a routine wellness examination for Abigail Watkins. Physical assessment deferred to PCP.  Exercise Activities and Dietary recommendations Current Exercise Habits: Home exercise routine, Type of exercise: walking;yoga(bicycling), Time (Minutes): 50, Frequency (Times/Week): 6, Weekly Exercise (Minutes/Week): 300, Intensity: Mild, Exercise limited by: None identified  Diet (meal preparation, eat out, water intake, caffeinated beverages, dairy products, fruits and vegetables): in general, a "healthy" diet  , well balanced. eats a variety of fruits and vegetables daily, limits salt, fat/cholesterol, sugar,carbohydrates,caffeine, drinks 6-8 glasses of water daily.  Goals    . Patient Stated     Stay as fit as I can and not get over weight and stay flexible by doing yoga. Enjoy life and family.       Fall Risk Fall Risk  09/05/2019 08/07/2018 07/26/2016  Falls in the past year? 0 0 No    Depression Screen PHQ 2/9 Scores 09/05/2019 08/07/2018 07/26/2016  PHQ - 2 Score 0 0 0     Cognitive Function       Ad8 score reviewed for issues:  Issues making decisions: no  Less interest in hobbies / activities: no  Repeats questions, stories (family complaining): no  Trouble using ordinary gadgets (microwave, computer, phone):no  Forgets the month or year: no  Mismanaging finances: no  Remembering appts: no  Daily problems with thinking and/or  memory: no Ad8 score is= 0  Immunization History  Administered Date(s) Administered  . Influenza, High Dose Seasonal PF 07/26/2016, 06/29/2017, 08/07/2018  . Influenza,inj,Quad PF,6+ Mos 09/03/2015  . Pneumococcal Conjugate-13 07/26/2016  . Pneumococcal Polysaccharide-23 08/07/2018  . Tdap 04/16/2015  . Zoster Recombinat (Shingrix) 06/20/2019    Screening Tests Health Maintenance  Topic Date Due  . MAMMOGRAM  09/25/2020  . COLONOSCOPY  01/10/2024  . TETANUS/TDAP  04/15/2025  . INFLUENZA VACCINE  Completed  . Hepatitis C Screening  Completed  . PNA vac Low Risk Adult  Completed  . DEXA SCAN  Addressed      Plan:      Reviewed health maintenance screenings with patient today and relevant education, vaccines, and/or referrals were provided.   Continue to eat heart healthy diet (full of fruits, vegetables,  whole grains, lean protein, water--limit salt, fat, and sugar intake) and increase physical activity as tolerated.  Continue doing brain stimulating activities (puzzles, reading, adult coloring books, staying active) to keep memory sharp.   I have personally reviewed and noted the following in the patient's chart:   . Medical and social history . Use of alcohol, tobacco or illicit drugs  . Current medications and supplements . Functional ability and status . Nutritional status . Physical activity . Advanced directives . List of other physicians . Screenings to include cognitive, depression, and falls . Referrals and appointments  In addition, I have reviewed and discussed with patient certain preventive protocols, quality metrics, and best practice recommendations. A written personalized care plan for preventive services as well as general preventive health recommendations were provided to patient.     Michiel Cowboy, RN  09/05/2019

## 2019-09-05 ENCOUNTER — Encounter: Payer: Self-pay | Admitting: Internal Medicine

## 2019-09-05 ENCOUNTER — Other Ambulatory Visit: Payer: Self-pay | Admitting: Internal Medicine

## 2019-09-05 ENCOUNTER — Other Ambulatory Visit: Payer: Self-pay

## 2019-09-05 ENCOUNTER — Other Ambulatory Visit (INDEPENDENT_AMBULATORY_CARE_PROVIDER_SITE_OTHER): Payer: Medicare Other

## 2019-09-05 ENCOUNTER — Ambulatory Visit (INDEPENDENT_AMBULATORY_CARE_PROVIDER_SITE_OTHER): Payer: Medicare Other | Admitting: *Deleted

## 2019-09-05 ENCOUNTER — Ambulatory Visit (INDEPENDENT_AMBULATORY_CARE_PROVIDER_SITE_OTHER): Payer: Medicare Other | Admitting: Internal Medicine

## 2019-09-05 VITALS — BP 120/84 | HR 62 | Temp 97.7°F | Ht 66.5 in | Wt 160.0 lb

## 2019-09-05 VITALS — BP 120/80 | HR 62 | Ht 65.5 in | Wt 160.0 lb

## 2019-09-05 DIAGNOSIS — Z0000 Encounter for general adult medical examination without abnormal findings: Secondary | ICD-10-CM

## 2019-09-05 DIAGNOSIS — Z853 Personal history of malignant neoplasm of breast: Secondary | ICD-10-CM | POA: Diagnosis not present

## 2019-09-05 DIAGNOSIS — Z1231 Encounter for screening mammogram for malignant neoplasm of breast: Secondary | ICD-10-CM

## 2019-09-05 LAB — COMPREHENSIVE METABOLIC PANEL
ALT: 14 U/L (ref 0–35)
AST: 15 U/L (ref 0–37)
Albumin: 4.3 g/dL (ref 3.5–5.2)
Alkaline Phosphatase: 52 U/L (ref 39–117)
BUN: 16 mg/dL (ref 6–23)
CO2: 31 mEq/L (ref 19–32)
Calcium: 9 mg/dL (ref 8.4–10.5)
Chloride: 103 mEq/L (ref 96–112)
Creatinine, Ser: 0.74 mg/dL (ref 0.40–1.20)
GFR: 77.54 mL/min (ref >60.00)
Glucose, Bld: 97 mg/dL (ref 70–99)
Potassium: 4.4 mEq/L (ref 3.5–5.1)
Sodium: 138 mEq/L (ref 135–145)
Total Bilirubin: 0.5 mg/dL (ref 0.2–1.2)
Total Protein: 6.7 g/dL (ref 6.0–8.3)

## 2019-09-05 LAB — CBC
HCT: 44.5 % (ref 36.0–46.0)
Hemoglobin: 14.8 g/dL (ref 12.0–15.0)
MCHC: 33.3 g/dL (ref 30.0–36.0)
MCV: 96.5 fl (ref 78.0–100.0)
Platelets: 269 10*3/uL (ref 150.0–400.0)
RBC: 4.62 Mil/uL (ref 3.87–5.11)
RDW: 13.1 % (ref 11.5–15.5)
WBC: 5.3 10*3/uL (ref 4.0–10.5)

## 2019-09-05 LAB — LIPID PANEL
Cholesterol: 176 mg/dL (ref 0–200)
HDL: 82.3 mg/dL (ref >39.00)
LDL Cholesterol: 83 mg/dL (ref 0–99)
NonHDL: 93.74
Total CHOL/HDL Ratio: 2
Triglycerides: 54 mg/dL (ref 0.0–149.0)
VLDL: 10.8 mg/dL (ref 0.0–40.0)

## 2019-09-05 MED ORDER — TRIAMCINOLONE ACETONIDE 0.1 % EX CREA: 1.0000 "application " | TOPICAL_CREAM | Freq: Two times a day (BID) | CUTANEOUS | 11 refills | Status: DC

## 2019-09-05 NOTE — Progress Notes (Signed)
   Subjective:   Patient ID: Abigail Watkins, female    DOB: 23-Nov-1950, 68 y.o.   MRN: [id number]  HPI The patient is a 68 YO female coming in for physical.   PMH, [facility], social history reviewed and updated.   Review of Systems  Constitutional: Negative.   HENT: Negative.   Eyes: Negative.   Respiratory: Negative for cough, chest tightness and shortness of breath.   Cardiovascular: Negative for chest pain, palpitations and leg swelling.  Gastrointestinal: Negative for abdominal distention, abdominal pain, constipation, diarrhea, nausea and vomiting.  Musculoskeletal: Negative.   Skin: Negative.   Neurological: Negative.   Psychiatric/Behavioral: Negative.     Objective:  Physical Exam Constitutional:      Appearance: She is well-developed.  HENT:     Head: Normocephalic and atraumatic.  Cardiovascular:     Rate and Rhythm: Normal rate and regular rhythm.  Pulmonary:     Effort: Pulmonary effort is normal. No respiratory distress.     Breath sounds: Normal breath sounds. No wheezing or rales.  Abdominal:     General: Bowel sounds are normal. There is no distension.     Palpations: Abdomen is soft.     Tenderness: There is no abdominal tenderness. There is no rebound.  Musculoskeletal:     Cervical back: Normal range of motion.  Skin:    General: Skin is warm and dry.  Neurological:     Mental Status: She is alert and oriented to person, place, and time.     Coordination: Coordination normal.     Vitals:   09/05/19 0937  BP: 120/84  Pulse: 62  Temp: 97.7 F (36.5 C)  TempSrc: Oral  SpO2: 99%  Weight: 160 lb (72.6 kg)  Height: 5' 6.5" (1.689 m)    This visit occurred during the SARS-CoV-2 public health emergency.  Safety protocols were in place, including screening questions prior to the visit, additional usage of staff PPE, and extensive cleaning of exam room while observing appropriate contact time as indicated for disinfecting solutions.   Assessment & Plan:

## 2019-09-05 NOTE — Patient Instructions (Signed)
Continue doing brain stimulating activities (puzzles, reading, adult coloring books, staying active) to keep memory sharp.   Continue to eat heart healthy diet (full of fruits, vegetables, whole grains, lean protein, water--limit salt, fat, and sugar intake) and increase physical activity as tolerated.   Abigail Watkins , Thank you for taking time to come for your Medicare Wellness Visit. I appreciate your ongoing commitment to your health goals. Please review the following plan we discussed and let me know if I can assist you in the future.   These are the goals we discussed: Goals    . Patient Stated     Stay as fit as I can and not get over weight and stay flexible by doing yoga. Enjoy life and family.       This is a list of the screening recommended for you and due dates:  Health Maintenance  Topic Date Due  . Mammogram  09/25/2020  . Colon Cancer Screening  01/10/2024  . Tetanus Vaccine  04/15/2025  . Flu Shot  Completed  .  Hepatitis C: One time screening is recommended by Center for Disease Control  (CDC) for  adults born from [birth year] through 1965.   Completed  . Pneumonia vaccines  Completed  . DEXA scan (bone density measurement)  Addressed    Preventive Care 57 Years and Older, Female Preventive care refers to lifestyle choices and visits with your health care provider that can promote health and wellness. This includes:  A yearly physical exam. This is also called an annual well check.  Regular dental and eye exams.  Immunizations.  Screening for certain conditions.  Healthy lifestyle choices, such as diet and exercise. What can I expect for my preventive care visit? Physical exam Your health care provider will check:  Height and weight. These may be used to calculate body mass index (BMI), which is a measurement that tells if you are at a healthy weight.  Heart rate and blood pressure.  Your skin for abnormal spots. Counseling Your health care provider may ask  you questions about:  Alcohol, tobacco, and drug use.  Emotional well-being.  Home and relationship well-being.  Sexual activity.  Eating habits.  History of falls.  Memory and ability to understand (cognition).  Work and work Statistician.  Pregnancy and menstrual history. What immunizations do I need?  Influenza (flu) vaccine  This is recommended every year. Tetanus, diphtheria, and pertussis (Tdap) vaccine  You may need a Td booster every 10 years. Varicella (chickenpox) vaccine  You may need this vaccine if you have not already been vaccinated. Zoster (shingles) vaccine  You may need this after age 18. Pneumococcal conjugate (PCV13) vaccine  One dose is recommended after age 26. Pneumococcal polysaccharide (PPSV23) vaccine  One dose is recommended after age 41. Measles, mumps, and rubella (MMR) vaccine  You may need at least one dose of MMR if you were born in 1957 or later. You may also need a second dose. Meningococcal conjugate (MenACWY) vaccine  You may need this if you have certain conditions. Hepatitis A vaccine  You may need this if you have certain conditions or if you travel or work in places where you may be exposed to hepatitis A. Hepatitis B vaccine  You may need this if you have certain conditions or if you travel or work in places where you may be exposed to hepatitis B. Haemophilus influenzae type b (Hib) vaccine  You may need this if you have certain conditions. You may receive  vaccines as individual doses or as more than one vaccine together in one shot (combination vaccines). Talk with your health care provider about the risks and benefits of combination vaccines. What tests do I need? Blood tests  Lipid and cholesterol levels. These may be checked every 5 years, or more frequently depending on your overall health.  Hepatitis C test.  Hepatitis B test. Screening  Lung cancer screening. You may have this screening every year  starting at age 70 if you have a 30-pack-year history of smoking and currently smoke or have quit within the past 15 years.  Colorectal cancer screening. All adults should have this screening starting at age 16 and continuing until age 30. Your health care provider may recommend screening at age 60 if you are at increased risk. You will have tests every 1-10 years, depending on your results and the type of screening test.  Diabetes screening. This is done by checking your blood sugar (glucose) after you have not eaten for a while (fasting). You may have this done every 1-3 years.  Mammogram. This may be done every 1-2 years. Talk with your health care provider about how often you should have regular mammograms.  BRCA-related cancer screening. This may be done if you have a family history of breast, ovarian, tubal, or peritoneal cancers. Other tests  Sexually transmitted disease (STD) testing.  Bone density scan. This is done to screen for osteoporosis. You may have this done starting at age 35. Follow these instructions at home: Eating and drinking  Eat a diet that includes fresh fruits and vegetables, whole grains, lean protein, and low-fat dairy products. Limit your intake of foods with high amounts of sugar, saturated fats, and salt.  Take vitamin and mineral supplements as recommended by your health care provider.  Do not drink alcohol if your health care provider tells you not to drink.  If you drink alcohol: ? Limit how much you have to 0-1 drink a day. ? Be aware of how much alcohol is in your drink. In the [place] one drink equals one 12 oz bottle of beer (355 mL), one 5 oz glass of Lemonte Al (148 mL), or one 1 oz glass of hard liquor (44 mL). Lifestyle  Take daily care of your teeth and gums.  Stay active. Exercise for at least 30 minutes on 5 or more days each week.  Do not use any products that contain nicotine or tobacco, such as cigarettes, e-cigarettes, and chewing tobacco. If  you need help quitting, ask your health care provider.  If you are sexually active, practice safe sex. Use a condom or other form of protection in order to prevent STIs (sexually transmitted infections).  Talk with your health care provider about taking a low-dose aspirin or statin. What's next?  Go to your health care provider once a year for a well check visit.  Ask your health care provider how often you should have your eyes and teeth checked.  Stay up to date on all vaccines. This information is not intended to replace advice given to you by your health care provider. Make sure you discuss any questions you have with your health care provider. Document Released: 10/02/2015 Document Revised: 08/30/2018 Document Reviewed: 08/30/2018 Elsevier Patient Education  2020 [employer].

## 2019-09-05 NOTE — Progress Notes (Signed)
Medical screening examination/treatment/procedure(s) were performed by non-physician practitioner and as supervising physician I was immediately available for consultation/collaboration. I agree with above. Loucile Posner A Mazzie Brodrick, MD 

## 2019-09-05 NOTE — Patient Instructions (Signed)
Health Maintenance, Female Adopting a healthy lifestyle and getting preventive care are important in promoting health and wellness. Ask your health care provider about:  The right schedule for you to have regular tests and exams.  Things you can do on your own to prevent diseases and keep yourself healthy. What should I know about diet, weight, and exercise? Eat a healthy diet   Eat a diet that includes plenty of vegetables, fruits, low-fat dairy products, and lean protein.  Do not eat a lot of foods that are high in solid fats, added sugars, or sodium. Maintain a healthy weight Body mass index (BMI) is used to identify weight problems. It estimates body fat based on height and weight. Your health care provider can help determine your BMI and help you achieve or maintain a healthy weight. Get regular exercise Get regular exercise. This is one of the most important things you can do for your health. Most adults should:  Exercise for at least 150 minutes each week. The exercise should increase your heart rate and make you sweat (moderate-intensity exercise).  Do strengthening exercises at least twice a week. This is in addition to the moderate-intensity exercise.  Spend less time sitting. Even light physical activity can be beneficial. Watch cholesterol and blood lipids Have your blood tested for lipids and cholesterol at 70 years of age, then have this test every 5 years. Have your cholesterol levels checked more often if:  Your lipid or cholesterol levels are high.  You are older than 70 years of age.  You are at high risk for heart disease. What should I know about cancer screening? Depending on your health history and family history, you may need to have cancer screening at various ages. This may include screening for:  Breast cancer.  Cervical cancer.  Colorectal cancer.  Skin cancer.  Lung cancer. What should I know about heart disease, diabetes, and high blood  pressure? Blood pressure and heart disease  High blood pressure causes heart disease and increases the risk of stroke. This is more likely to develop in people who have high blood pressure readings, are of African descent, or are overweight.  Have your blood pressure checked: ? Every 3-5 years if you are 18-39 years of age. ? Every year if you are 40 years old or older. Diabetes Have regular diabetes screenings. This checks your fasting blood sugar level. Have the screening done:  Once every three years after age 40 if you are at a normal weight and have a low risk for diabetes.  More often and at a younger age if you are overweight or have a high risk for diabetes. What should I know about preventing infection? Hepatitis B If you have a higher risk for hepatitis B, you should be screened for this virus. Talk with your health care provider to find out if you are at risk for hepatitis B infection. Hepatitis C Testing is recommended for:  Everyone born from 1945 through 1965.  Anyone with known risk factors for hepatitis C. Sexually transmitted infections (STIs)  Get screened for STIs, including gonorrhea and chlamydia, if: ? You are sexually active and are younger than 70 years of age. ? You are older than 70 years of age and your health care provider tells you that you are at risk for this type of infection. ? Your sexual activity has changed since you were last screened, and you are at increased risk for chlamydia or gonorrhea. Ask your health care provider if   you are at risk.  Ask your health care provider about whether you are at high risk for HIV. Your health care provider may recommend a prescription medicine to help prevent HIV infection. If you choose to take medicine to prevent HIV, you should first get tested for HIV. You should then be tested every 3 months for as long as you are taking the medicine. Pregnancy  If you are about to stop having your period (premenopausal) and  you may become pregnant, seek counseling before you get pregnant.  Take 400 to 800 micrograms (mcg) of folic acid every day if you become pregnant.  Ask for birth control (contraception) if you want to prevent pregnancy. Osteoporosis and menopause Osteoporosis is a disease in which the bones lose minerals and strength with aging. This can result in bone fractures. If you are 65 years old or older, or if you are at risk for osteoporosis and fractures, ask your health care provider if you should:  Be screened for bone loss.  Take a calcium or vitamin D supplement to lower your risk of fractures.  Be given hormone replacement therapy (HRT) to treat symptoms of menopause. Follow these instructions at home: Lifestyle  Do not use any products that contain nicotine or tobacco, such as cigarettes, e-cigarettes, and chewing tobacco. If you need help quitting, ask your health care provider.  Do not use street drugs.  Do not share needles.  Ask your health care provider for help if you need support or information about quitting drugs. Alcohol use  Do not drink alcohol if: ? Your health care provider tells you not to drink. ? You are pregnant, may be pregnant, or are planning to become pregnant.  If you drink alcohol: ? Limit how much you use to 0-1 drink a day. ? Limit intake if you are breastfeeding.  Be aware of how much alcohol is in your drink. In the [place] one drink equals one 12 oz bottle of beer (355 mL), one 5 oz glass of wine (148 mL), or one 1 oz glass of hard liquor (44 mL). General instructions  Schedule regular health, dental, and eye exams.  Stay current with your vaccines.  Tell your health care provider if: ? You often feel depressed. ? You have ever been abused or do not feel safe at home. Summary  Adopting a healthy lifestyle and getting preventive care are important in promoting health and wellness.  Follow your health care provider's instructions about healthy  diet, exercising, and getting tested or screened for diseases.  Follow your health care provider's instructions on monitoring your cholesterol and blood pressure. This information is not intended to replace advice given to you by your health care provider. Make sure you discuss any questions you have with your health care provider. Document Released: 03/21/2011 Document Revised: 08/29/2018 Document Reviewed: 08/29/2018 Elsevier Patient Education  2020 [employer] Inc[employer]  

## 2019-09-05 NOTE — Assessment & Plan Note (Signed)
Gets mammogram yearly, still in remission.

## 2019-09-05 NOTE — Assessment & Plan Note (Signed)
Flu shot up to date. Pneumonia complete. Shingrix got 1st, awaiting second soon. Tetanus due 2026. Colonoscopy due 2025. Mammogram due 2021, pap smear aged out and dexa due 2021. Counseled about sun safety and mole surveillance. Counseled about the dangers of distracted driving. Given 10 year screening recommendations.

## 2019-10-21 ENCOUNTER — Ambulatory Visit
Admission: RE | Admit: 2019-10-21 | Discharge: 2019-10-21 | Disposition: A | Discharge status: Home / Self Care | Payer: Medicare PPO | Source: Ambulatory Visit | Attending: Internal Medicine | Admitting: Internal Medicine

## 2019-10-21 ENCOUNTER — Other Ambulatory Visit: Payer: Self-pay

## 2019-10-21 DIAGNOSIS — Z1231 Encounter for screening mammogram for malignant neoplasm of breast: Secondary | ICD-10-CM | POA: Diagnosis not present

## 2019-10-28 ENCOUNTER — Ambulatory Visit: Payer: Medicare PPO

## 2019-11-16 ENCOUNTER — Ambulatory Visit: Payer: Medicare Other

## 2020-01-13 DIAGNOSIS — H16223 Keratoconjunctivitis sicca, not specified as Sjogren's, bilateral: Secondary | ICD-10-CM | POA: Diagnosis not present

## 2020-01-13 DIAGNOSIS — H2513 Age-related nuclear cataract, bilateral: Secondary | ICD-10-CM | POA: Diagnosis not present

## 2020-01-13 DIAGNOSIS — H1045 Other chronic allergic conjunctivitis: Secondary | ICD-10-CM | POA: Diagnosis not present

## 2020-02-26 DIAGNOSIS — L570 Actinic keratosis: Secondary | ICD-10-CM | POA: Diagnosis not present

## 2020-02-26 DIAGNOSIS — L814 Other melanin hyperpigmentation: Secondary | ICD-10-CM | POA: Diagnosis not present

## 2020-02-26 DIAGNOSIS — L280 Lichen simplex chronicus: Secondary | ICD-10-CM | POA: Diagnosis not present

## 2020-02-26 DIAGNOSIS — D1801 Hemangioma of skin and subcutaneous tissue: Secondary | ICD-10-CM | POA: Diagnosis not present

## 2020-02-26 DIAGNOSIS — D692 Other nonthrombocytopenic purpura: Secondary | ICD-10-CM | POA: Diagnosis not present

## 2020-02-26 DIAGNOSIS — D485 Neoplasm of uncertain behavior of skin: Secondary | ICD-10-CM | POA: Diagnosis not present

## 2020-02-26 DIAGNOSIS — L2089 Other atopic dermatitis: Secondary | ICD-10-CM | POA: Diagnosis not present

## 2020-02-26 DIAGNOSIS — L430 Hypertrophic lichen planus: Secondary | ICD-10-CM | POA: Diagnosis not present

## 2020-05-06 ENCOUNTER — Other Ambulatory Visit: Payer: Medicare PPO

## 2020-05-18 DIAGNOSIS — H0288B Meibomian gland dysfunction left eye, upper and lower eyelids: Secondary | ICD-10-CM | POA: Diagnosis not present

## 2020-05-18 DIAGNOSIS — H16223 Keratoconjunctivitis sicca, not specified as Sjogren's, bilateral: Secondary | ICD-10-CM | POA: Diagnosis not present

## 2020-05-18 DIAGNOSIS — H0288A Meibomian gland dysfunction right eye, upper and lower eyelids: Secondary | ICD-10-CM | POA: Diagnosis not present

## 2020-07-24 ENCOUNTER — Ambulatory Visit: Payer: Medicare PPO | Attending: Internal Medicine

## 2020-07-24 DIAGNOSIS — Z23 Encounter for immunization: Secondary | ICD-10-CM

## 2020-07-24 NOTE — Progress Notes (Signed)
   Covid-[facility] Vaccination Clinic  Name:  Aviannah Castoro    MRN: [id number] DOB: 01-01-1951  07/24/2020  Ms. Bowery was observed post Covid-19 immunization for 15 minutes without incident. She was provided with Vaccine Information Sheet and instruction to access the V-Safe system.   Ms. Mares was instructed to call 911 with any severe reactions post vaccine: Marland Kitchen Difficulty breathing  . Swelling of face and throat  . A fast heartbeat  . A bad rash all over body  . Dizziness and weakness

## 2020-09-23 DIAGNOSIS — Z20822 Contact with and (suspected) exposure to covid-19: Secondary | ICD-10-CM | POA: Diagnosis not present

## 2020-09-28 ENCOUNTER — Other Ambulatory Visit: Payer: Self-pay | Admitting: Internal Medicine

## 2020-09-28 DIAGNOSIS — Z0000 Encounter for general adult medical examination without abnormal findings: Secondary | ICD-10-CM

## 2020-09-29 DIAGNOSIS — H0288B Meibomian gland dysfunction left eye, upper and lower eyelids: Secondary | ICD-10-CM | POA: Diagnosis not present

## 2020-09-29 DIAGNOSIS — H16223 Keratoconjunctivitis sicca, not specified as Sjogren's, bilateral: Secondary | ICD-10-CM | POA: Diagnosis not present

## 2020-09-29 DIAGNOSIS — H0288A Meibomian gland dysfunction right eye, upper and lower eyelids: Secondary | ICD-10-CM | POA: Diagnosis not present

## 2020-11-16 ENCOUNTER — Other Ambulatory Visit: Payer: Self-pay

## 2020-11-16 ENCOUNTER — Other Ambulatory Visit: Payer: Self-pay | Admitting: Internal Medicine

## 2020-11-16 ENCOUNTER — Ambulatory Visit
Admission: RE | Admit: 2020-11-16 | Discharge: 2020-11-16 | Disposition: A | Discharge status: Home / Self Care | Payer: Medicare PPO | Source: Ambulatory Visit | Attending: Internal Medicine | Admitting: Internal Medicine

## 2020-11-16 DIAGNOSIS — Z0000 Encounter for general adult medical examination without abnormal findings: Secondary | ICD-10-CM

## 2020-11-16 DIAGNOSIS — Z1231 Encounter for screening mammogram for malignant neoplasm of breast: Secondary | ICD-10-CM | POA: Diagnosis not present

## 2020-11-18 DIAGNOSIS — L570 Actinic keratosis: Secondary | ICD-10-CM | POA: Diagnosis not present

## 2020-12-04 ENCOUNTER — Encounter: Payer: Medicare PPO | Admitting: Internal Medicine

## 2020-12-04 ENCOUNTER — Ambulatory Visit: Payer: Medicare PPO

## 2020-12-29 DIAGNOSIS — L570 Actinic keratosis: Secondary | ICD-10-CM | POA: Diagnosis not present

## 2020-12-29 DIAGNOSIS — D1801 Hemangioma of skin and subcutaneous tissue: Secondary | ICD-10-CM | POA: Diagnosis not present

## 2020-12-29 DIAGNOSIS — L0109 Other impetigo: Secondary | ICD-10-CM | POA: Diagnosis not present

## 2020-12-29 DIAGNOSIS — L814 Other melanin hyperpigmentation: Secondary | ICD-10-CM | POA: Diagnosis not present

## 2020-12-29 DIAGNOSIS — L0889 Other specified local infections of the skin and subcutaneous tissue: Secondary | ICD-10-CM | POA: Diagnosis not present

## 2021-01-14 ENCOUNTER — Encounter: Payer: Medicare PPO | Admitting: Internal Medicine

## 2021-01-15 ENCOUNTER — Other Ambulatory Visit: Payer: Self-pay

## 2021-01-15 ENCOUNTER — Ambulatory Visit (INDEPENDENT_AMBULATORY_CARE_PROVIDER_SITE_OTHER): Payer: Medicare PPO

## 2021-01-15 ENCOUNTER — Ambulatory Visit (INDEPENDENT_AMBULATORY_CARE_PROVIDER_SITE_OTHER): Payer: Medicare PPO | Admitting: Internal Medicine

## 2021-01-15 ENCOUNTER — Encounter: Payer: Self-pay | Admitting: Internal Medicine

## 2021-01-15 VITALS — BP 130/80 | HR 72 | Temp 98.1°F | Resp 18 | Ht 66.0 in | Wt 159.0 lb

## 2021-01-15 VITALS — BP 130/80 | HR 72 | Temp 98.1°F | Resp 16 | Ht 66.0 in | Wt 159.0 lb

## 2021-01-15 DIAGNOSIS — Z853 Personal history of malignant neoplasm of breast: Secondary | ICD-10-CM | POA: Diagnosis not present

## 2021-01-15 DIAGNOSIS — Z0000 Encounter for general adult medical examination without abnormal findings: Secondary | ICD-10-CM | POA: Diagnosis not present

## 2021-01-15 LAB — COMPREHENSIVE METABOLIC PANEL
ALT: 14 U/L (ref 0–35)
AST: 16 U/L (ref 0–37)
Albumin: 4.5 g/dL (ref 3.5–5.2)
Alkaline Phosphatase: 52 U/L (ref 39–117)
BUN: 15 mg/dL (ref 6–23)
CO2: 30 mEq/L (ref 19–32)
Calcium: 9.1 mg/dL (ref 8.4–10.5)
Chloride: 102 mEq/L (ref 96–112)
Creatinine, Ser: 0.83 mg/dL (ref 0.40–1.20)
GFR: 70.84 mL/min (ref >60.00)
Glucose, Bld: 93 mg/dL (ref 70–99)
Potassium: 4.2 mEq/L (ref 3.5–5.1)
Sodium: 139 mEq/L (ref 135–145)
Total Bilirubin: 0.7 mg/dL (ref 0.2–1.2)
Total Protein: 7 g/dL (ref 6.0–8.3)

## 2021-01-15 LAB — CBC
HCT: 44.4 % (ref 36.0–46.0)
Hemoglobin: 15 g/dL (ref 12.0–15.0)
MCHC: 33.9 g/dL (ref 30.0–36.0)
MCV: 96 fl (ref 78.0–100.0)
Platelets: 287 10*3/uL (ref 150.0–400.0)
RBC: 4.62 Mil/uL (ref 3.87–5.11)
RDW: 13 % (ref 11.5–15.5)
WBC: 4.8 10*3/uL (ref 4.0–10.5)

## 2021-01-15 LAB — LIPID PANEL
Cholesterol: 183 mg/dL (ref 0–200)
HDL: 86.7 mg/dL (ref >39.00)
LDL Cholesterol: 84 mg/dL (ref 0–99)
NonHDL: 96.57
Total CHOL/HDL Ratio: 2
Triglycerides: 64 mg/dL (ref 0.0–149.0)
VLDL: 12.8 mg/dL (ref 0.0–40.0)

## 2021-01-15 MED ORDER — TRIAMCINOLONE ACETONIDE 0.1 % EX CREA: 1.0000 "application " | TOPICAL_CREAM | Freq: Two times a day (BID) | CUTANEOUS | 11 refills | Status: DC

## 2021-01-15 NOTE — Progress Notes (Signed)
Subjective:   Abigail Watkins is a 70 y.o. female who presents for Medicare Annual (Subsequent) preventive examination.  Review of Systems    No ROS. Medicare Wellness Visit. Additional risk factors are reflected in social history. Cardiac Risk Factors include: advanced age (>63men, >13 women)     Objective:    Today's Vitals   01/15/21 1322  BP: 130/80  Pulse: 72  Resp: 16  Temp: 98.1 F (36.7 C)  SpO2: 98%  Weight: 159 lb (72.1 kg)  Height: 5\' 6"  (1.676 m)  PainSc: 0-No pain   Body mass index is 25.66 kg/m.  Advanced Directives 01/15/2021 09/05/2019 07/10/2017  Does Patient Have a Medical Advance Directive? Yes Yes Yes  Type of Advance Directive Living will;Healthcare Power of [place] -  Does patient want to make changes to medical advance directive? No - Patient declined - -  Copy of [place] in Chart? No - copy requested No - copy requested -    Current Medications (verified) Outpatient Encounter Medications as of 01/15/2021  Medication Sig  . triamcinolone cream (KENALOG) 0.1 % Apply 1 application topically 2 (two) times daily.   No facility-administered encounter medications on file as of 01/15/2021.    Allergies (verified) Patient has no known allergies.   History: Past Medical History:  Diagnosis Date  . Allergy   . Breast cancer ([place])   . Cancer [facility]) 2004   breast   Past Surgical History:  Procedure Laterality Date  . BREAST BIOPSY Left 1995  . breast biospy Right 2003  . BREAST SURGERY Right 2003  . MASTECTOMY    . TONSILLECTOMY AND ADENOIDECTOMY  1958  . TUBAL LIGATION  1989   Family History  Problem Relation Age of Onset  . Cancer Maternal Grandmother   . Breast cancer Paternal Grandmother   . Colon cancer Neg Hx    Social History   Socioeconomic History  . Marital status: Married    Spouse name: Not on file  . Number of children: 2  . Years of education: Not on file  . Highest  education level: Not on file  Occupational History  . Occupation: Retired  Tobacco Use  . Smoking status: Former Smoker    Quit date: 09/19/1978    Years since quitting: 42.3  . Smokeless tobacco: Never Used  Vaping Use  . Vaping Use: Never used  Substance and Sexual Activity  . Alcohol use: Yes    Alcohol/week: 5.0 standard drinks    Types: 5 Glasses of wine per week  . Drug use: No  . Sexual activity: Yes  Other Topics Concern  . Not on file  Social History Narrative  . Not on file   Social Determinants of Health   Financial Resource Strain: Low Risk   . Difficulty of Paying Living Expenses: Not hard at all  Food Insecurity: No Food Insecurity  . Worried About Charity fundraiser in the Last Year: Never true  . Ran Out of Food in the Last Year: Never true  Transportation Needs: No Transportation Needs  . Lack of Transportation (Medical): No  . Lack of Transportation (Non-Medical): No  Physical Activity: Sufficiently Active  . Days of Exercise per Week: 7 days  . Minutes of Exercise per Session: 60 min  Stress: No Stress Concern Present  . Feeling of Stress : Not at all  Social Connections: Socially Integrated  . Frequency of Communication with Friends and Family: More than three  times a week  . Frequency of Social Gatherings with Friends and Family: More than three times a week  . Attends Religious Services: More than 4 times per year  . Active Member of Clubs or Organizations: Yes  . Attends Archivist Meetings: More than 4 times per year  . Marital Status: Married    Tobacco Counseling Counseling given: Not Answered   Clinical Intake:  Pre-visit preparation completed: Yes  Pain : No/denies pain Pain Score: 0-No pain     Diabetes: No  How often do you need to have someone help you when you read instructions, pamphlets, or other written materials from your doctor or pharmacy?: 1 - Never  Diabetic? no  Interpreter Needed?: No  Information  entered by :: Lisette Abu, LPN   Activities of Daily Living In your present state of health, do you have any difficulty performing the following activities: 01/15/2021  Hearing? N  Vision? N  Difficulty concentrating or making decisions? N  Walking or climbing stairs? N  Dressing or bathing? N  Doing errands, shopping? N  Preparing Food and eating ? N  Using the Toilet? N  In the past six months, have you accidently leaked urine? N  Do you have problems with loss of bowel control? N  Managing your Medications? N  Managing your Finances? N  Housekeeping or managing your Housekeeping? N  Some recent data might be hidden    Patient Care Team: Hoyt Koch, MD as PCP - General (Internal Medicine)  Indicate any recent Medical Services you may have received from other than [facility] providers in the past year (date may be approximate).     Assessment:   This is a routine wellness examination for Abigail Watkins.  Hearing/Vision screen No exam data present  Dietary issues and exercise activities discussed: Current Exercise Habits: Home exercise routine, Type of exercise: walking;yoga;Other - see comments;strength training/weights;stretching;treadmill (yoga 3 x week, walk 7 days a week, 10,000 steps daily; walks dog), Time (Minutes): 60, Frequency (Times/Week): 7, Weekly Exercise (Minutes/Week): 420, Intensity: Moderate  Goals    . Patient Stated     Stay as fit as I can and not get over weight and stay flexible by doing yoga. Enjoy life and family.      Depression Screen PHQ 2/9 Scores 01/15/2021 09/05/2019 08/07/2018 07/26/2016  PHQ - 2 Score 0 0 0 0    Fall Risk Fall Risk  01/15/2021 09/05/2019 08/07/2018 07/26/2016  Falls in the past year? 0 0 0 No  Number falls in past yr: 0 - - -  Injury with Fall? 0 - - -  Risk for fall due to : No Fall Risks - - -  Follow up Falls evaluation completed - - -    FALL RISK PREVENTION PERTAINING TO THE HOME:  Any stairs in or around  the home? Yes  If so, are there any without handrails? No  Home free of loose throw rugs in walkways, pet beds, electrical cords, etc? Yes  Adequate lighting in your home to reduce risk of falls? Yes   ASSISTIVE DEVICES UTILIZED TO PREVENT FALLS:  Life alert? No  Use of a cane, walker or w/c? No  Grab bars in the bathroom? No  Shower chair or bench in shower? No  Elevated toilet seat or a handicapped toilet? Yes   TIMED UP AND GO:  Was the test performed? No .  Length of time to ambulate 10 feet: 0 sec.   Gait steady and fast  without use of assistive device  Cognitive Function: Normal cognitive status assessed by direct observation by this Nurse Health Advisor. No abnormalities found.          Immunizations Immunization History  Administered Date(s) Administered  . Influenza, High Dose Seasonal PF 07/26/2016, 06/29/2017, 08/07/2018  . Influenza,inj,Quad PF,6+ Mos 09/03/2015  . Moderna SARS-COV2 Booster Vaccination 07/24/2020  . Moderna Sars-Covid-2 Vaccination 10/19/2019, 11/18/2019, 07/24/2020  . Pneumococcal Conjugate-13 07/26/2016  . Pneumococcal Polysaccharide-23 08/07/2018  . Tdap 04/16/2015  . Zoster Recombinat (Shingrix) 06/22/2019, 10/02/2019    TDAP status: Up to date  Flu Vaccine status: Due, Education has been provided regarding the importance of this vaccine. Advised may receive this vaccine at local pharmacy or Health Dept. Aware to provide a copy of the vaccination record if obtained from local pharmacy or Health Dept. Verbalized acceptance and understanding.  Pneumococcal vaccine status: Up to date  Covid-19 vaccine status: Completed vaccines  Qualifies for Shingles Vaccine? Yes   Zostavax completed No   Shingrix Completed?: Yes  Screening Tests Health Maintenance  Topic Date Due  . INFLUENZA VACCINE  04/19/2021  . MAMMOGRAM  11/16/2022  . COLONOSCOPY (Pts 45-75yrs Insurance coverage will need to be confirmed)  01/10/2024  . TETANUS/TDAP   04/15/2025  . COVID-19 Vaccine  Completed  . Hepatitis C Screening  Completed  . PNA vac Low Risk Adult  Completed  . DEXA SCAN  Addressed  . HPV VACCINES  Aged Out    Health Maintenance  There are no preventive care reminders to display for this patient.  Colorectal cancer screening: Type of screening: Colonoscopy. Completed 01/09/2014. Repeat every 10 years  Mammogram status: Completed 11/16/2020. Repeat every year  Bone density: never done  Lung Cancer Screening: (Low Dose CT Chest recommended if Age 64-80 years, 30 pack-year currently smoking OR have quit w/in 15years.) does not qualify.   Lung Cancer Screening Referral: no  Additional Screening:  Hepatitis C Screening: does qualify; Completed: yes  Vision Screening: Recommended annual ophthalmology exams for early detection of glaucoma and other disorders of the eye. Is the patient up to date with their annual eye exam?  Yes  Who is the provider or what is the name of the office in which the patient attends annual eye exams? Camillo Flaming, MD If pt is not established with a provider, would they like to be referred to a provider to establish care? No .   Dental Screening: Recommended annual dental exams for proper oral hygiene  Community Resource Referral / Chronic Care Management: CRR required this visit?  No   CCM required this visit?  No      Plan:     I have personally reviewed and noted the following in the patient's chart:   . Medical and social history . Use of alcohol, tobacco or illicit drugs  . Current medications and supplements . Functional ability and status . Nutritional status . Physical activity . Advanced directives . List of other physicians . Hospitalizations, surgeries, and ER visits in previous 12 months . Vitals . Screenings to include cognitive, depression, and falls . Referrals and appointments  In addition, I have reviewed and discussed with patient certain preventive protocols,  quality metrics, and best practice recommendations. A written personalized care plan for preventive services as well as general preventive health recommendations were provided to patient.     Sheral Flow, LPN   [id number]   Nurse Notes:  Medications reviewed with patient; no opioid use noted.

## 2021-01-15 NOTE — Patient Instructions (Addendum)
We are checking the labs today.   Health Maintenance, Female Adopting a healthy lifestyle and getting preventive care are important in promoting health and wellness. Ask your health care provider about:  The right schedule for you to have regular tests and exams.  Things you can do on your own to prevent diseases and keep yourself healthy. What should I know about diet, weight, and exercise? Eat a healthy diet  Eat a diet that includes plenty of vegetables, fruits, low-fat dairy products, and lean protein.  Do not eat a lot of foods that are high in solid fats, added sugars, or sodium.   Maintain a healthy weight Body mass index (BMI) is used to identify weight problems. It estimates body fat based on height and weight. Your health care provider can help determine your BMI and help you achieve or maintain a healthy weight. Get regular exercise Get regular exercise. This is one of the most important things you can do for your health. Most adults should:  Exercise for at least 150 minutes each week. The exercise should increase your heart rate and make you sweat (moderate-intensity exercise).  Do strengthening exercises at least twice a week. This is in addition to the moderate-intensity exercise.  Spend less time sitting. Even light physical activity can be beneficial. Watch cholesterol and blood lipids Have your blood tested for lipids and cholesterol at 72 years of age, then have this test every 5 years. Have your cholesterol levels checked more often if:  Your lipid or cholesterol levels are high.  You are older than 72 years of age.  You are at high risk for heart disease. What should I know about cancer screening? Depending on your health history and family history, you may need to have cancer screening at various ages. This may include screening for:  Breast cancer.  Cervical cancer.  Colorectal cancer.  Skin cancer.  Lung cancer. What should I know about heart  disease, diabetes, and high blood pressure? Blood pressure and heart disease  High blood pressure causes heart disease and increases the risk of stroke. This is more likely to develop in people who have high blood pressure readings, are of African descent, or are overweight.  Have your blood pressure checked: ? Every 3-5 years if you are 18-39 years of age. ? Every year if you are 40 years old or older. Diabetes Have regular diabetes screenings. This checks your fasting blood sugar level. Have the screening done:  Once every three years after age 40 if you are at a normal weight and have a low risk for diabetes.  More often and at a younger age if you are overweight or have a high risk for diabetes. What should I know about preventing infection? Hepatitis B If you have a higher risk for hepatitis B, you should be screened for this virus. Talk with your health care provider to find out if you are at risk for hepatitis B infection. Hepatitis C Testing is recommended for:  Everyone born from 1945 through 1965.  Anyone with known risk factors for hepatitis C. Sexually transmitted infections (STIs)  Get screened for STIs, including gonorrhea and chlamydia, if: ? You are sexually active and are younger than 72 years of age. ? You are older than 72 years of age and your health care provider tells you that you are at risk for this type of infection. ? Your sexual activity has changed since you were last screened, and you are at increased risk for   chlamydia or gonorrhea. Ask your health care provider if you are at risk.  Ask your health care provider about whether you are at high risk for HIV. Your health care provider may recommend a prescription medicine to help prevent HIV infection. If you choose to take medicine to prevent HIV, you should first get tested for HIV. You should then be tested every 3 months for as long as you are taking the medicine. Pregnancy  If you are about to stop  having your period (premenopausal) and you may become pregnant, seek counseling before you get pregnant.  Take 400 to 800 micrograms (mcg) of folic acid every day if you become pregnant.  Ask for birth control (contraception) if you want to prevent pregnancy. Osteoporosis and menopause Osteoporosis is a disease in which the bones lose minerals and strength with aging. This can result in bone fractures. If you are 79 years old or older, or if you are at risk for osteoporosis and fractures, ask your health care provider if you should:  Be screened for bone loss.  Take a calcium or vitamin D supplement to lower your risk of fractures.  Be given hormone replacement therapy (HRT) to treat symptoms of menopause. Follow these instructions at home: Lifestyle  Do not use any products that contain nicotine or tobacco, such as cigarettes, e-cigarettes, and chewing tobacco. If you need help quitting, ask your health care provider.  Do not use street drugs.  Do not share needles.  Ask your health care provider for help if you need support or information about quitting drugs. Alcohol use  Do not drink alcohol if: ? Your health care provider tells you not to drink. ? You are pregnant, may be pregnant, or are planning to become pregnant.  If you drink alcohol: ? Limit how much you use to 0-1 drink a day. ? Limit intake if you are breastfeeding.  Be aware of how much alcohol is in your drink. In the [place] one drink equals one 12 oz bottle of beer (355 mL), one 5 oz glass of wine (148 mL), or one 1 oz glass of hard liquor (44 mL). General instructions  Schedule regular health, dental, and eye exams.  Stay current with your vaccines.  Tell your health care provider if: ? You often feel depressed. ? You have ever been abused or do not feel safe at home. Summary  Adopting a healthy lifestyle and getting preventive care are important in promoting health and wellness.  Follow your health  care provider's instructions about healthy diet, exercising, and getting tested or screened for diseases.  Follow your health care provider's instructions on monitoring your cholesterol and blood pressure. This information is not intended to replace advice given to you by your health care provider. Make sure you discuss any questions you have with your health care provider. Document Revised: 08/29/2018 Document Reviewed: 08/29/2018 Elsevier Patient Education  2021 [employer].

## 2021-01-15 NOTE — Progress Notes (Signed)
   Subjective:   Patient ID: Abigail Watkins, female    DOB: 11/22/1950, 70 y.o.   MRN: [id number]  HPI The patient is a 70 YO female coming in for physical.   PMH, [place], social history reviewed and updated  Review of Systems  Constitutional: Negative.   HENT: Negative.   Eyes: Negative.   Respiratory: Negative for cough, chest tightness and shortness of breath.   Cardiovascular: Negative for chest pain, palpitations and leg swelling.  Gastrointestinal: Negative for abdominal distention, abdominal pain, constipation, diarrhea, nausea and vomiting.  Musculoskeletal: Negative.   Skin: Negative.   Neurological: Negative.   Psychiatric/Behavioral: Negative.     Objective:  Physical Exam Constitutional:      Appearance: She is well-developed.  HENT:     Head: Normocephalic and atraumatic.  Cardiovascular:     Rate and Rhythm: Normal rate and regular rhythm.  Pulmonary:     Effort: Pulmonary effort is normal. No respiratory distress.     Breath sounds: Normal breath sounds. No wheezing or rales.  Abdominal:     General: Bowel sounds are normal. There is no distension.     Palpations: Abdomen is soft.     Tenderness: There is no abdominal tenderness. There is no rebound.  Musculoskeletal:     Cervical back: Normal range of motion.  Skin:    General: Skin is warm and dry.  Neurological:     Mental Status: She is alert and oriented to person, place, and time.     Coordination: Coordination normal.     Vitals:   01/15/21 1354  BP: 130/80  Pulse: 72  Resp: 18  Temp: 98.1 F (36.7 C)  TempSrc: Oral  SpO2: 98%  Weight: 159 lb (72.1 kg)  Height: 5\' 6"  (1.676 m)    This visit occurred during the SARS-CoV-2 public health emergency.  Safety protocols were in place, including screening questions prior to the visit, additional usage of staff PPE, and extensive cleaning of exam room while observing appropriate contact time as indicated for disinfecting solutions.   Assessment &  Plan:

## 2021-01-15 NOTE — Assessment & Plan Note (Signed)
Yearly mammogram normal and will continue.

## 2021-01-15 NOTE — Assessment & Plan Note (Signed)
Flu shot yearly. Covid-19 3 shots counseled about 4th. Pneumonia complete. Shingrix complete. Tetanus due 2026. Colonoscopy due 2025. Mammogram due 2023, pap smear aged out and dexa due with gyn. Counseled about sun safety and mole surveillance. Counseled about the dangers of distracted driving. Given 10 year screening recommendations.

## 2021-01-15 NOTE — Patient Instructions (Signed)
Abigail Watkins , Thank you for taking time to come for your Medicare Wellness Visit. I appreciate your ongoing commitment to your health goals. Please review the following plan we discussed and let me know if I can assist you in the future.   Screening recommendations/referrals: Colonoscopy: 01/09/2014; due every 10 years Mammogram: 11/16/2020; due every year Bone Density: never done Recommended yearly ophthalmology/optometry visit for glaucoma screening and checkup Recommended yearly dental visit for hygiene and checkup  Vaccinations: Influenza vaccine: due 04/2021 Pneumococcal vaccine: 07/26/2016, 08/07/2018 Tdap vaccine: 04/16/2015; due every 10 years Shingles vaccine: 06/22/2019, 10/02/2019   Covid-19: 10/19/19, 3/1/201, 07/24/2020  Advanced directives: Please bring a copy of your health care power of attorney and living will to the office at your convenience.  Conditions/risks identified: Yes; Reviewed health maintenance screenings with patient today and relevant education, vaccines, and/or referrals were provided. Please continue to do your personal lifestyle choices by: daily care of teeth and gums, regular physical activity (goal should be 5 days a week for 30 minutes), eat a healthy diet, avoid tobacco and drug use, limiting any alcohol intake, taking a low-dose aspirin (if not allergic or have been advised by your provider otherwise) and taking vitamins and minerals as recommended by your provider. Continue doing brain stimulating activities (puzzles, reading, adult coloring books, staying active) to keep memory sharp. Continue to eat heart healthy diet (full of fruits, vegetables, whole grains, lean protein, water--limit salt, fat, and sugar intake) and increase physical activity as tolerated.  Next appointment: Please schedule your next Medicare Wellness Visit with your Nurse Health Advisor in 1 year by calling [phone].   Preventive Care 72 Years and Older, Female Preventive care refers  to lifestyle choices and visits with your health care provider that can promote health and wellness. What does preventive care include?  A yearly physical exam. This is also called an annual well check.  Dental exams once or twice a year.  Routine eye exams. Ask your health care provider how often you should have your eyes checked.  Personal lifestyle choices, including:  Daily care of your teeth and gums.  Regular physical activity.  Eating a healthy diet.  Avoiding tobacco and drug use.  Limiting alcohol use.  Practicing safe sex.  Taking low-dose aspirin every day.  Taking vitamin and mineral supplements as recommended by your health care provider. What happens during an annual well check? The services and screenings done by your health care provider during your annual well check will depend on your age, overall health, lifestyle risk factors, and family history of disease. Counseling  Your health care provider may ask you questions about your:  Alcohol use.  Tobacco use.  Drug use.  Emotional well-being.  Home and relationship well-being.  Sexual activity.  Eating habits.  History of falls.  Memory and ability to understand (cognition).  Work and work Statistician.  Reproductive health. Screening  You may have the following tests or measurements:  Height, weight, and BMI.  Blood pressure.  Lipid and cholesterol levels. These may be checked every 5 years, or more frequently if you are over 69 years old.  Skin check.  Lung cancer screening. You may have this screening every year starting at age 76 if you have a 30-pack-year history of smoking and currently smoke or have quit within the past 15 years.  Fecal occult blood test (FOBT) of the stool. You may have this test every year starting at age 53.  Flexible sigmoidoscopy or colonoscopy. You may have  a sigmoidoscopy every 5 years or a colonoscopy every 10 years starting at age 30.  Hepatitis C  blood test.  Hepatitis B blood test.  Sexually transmitted disease (STD) testing.  Diabetes screening. This is done by checking your blood sugar (glucose) after you have not eaten for a while (fasting). You may have this done every 1-3 years.  Bone density scan. This is done to screen for osteoporosis. You may have this done starting at age 35.  Mammogram. This may be done every 1-2 years. Talk to your health care provider about how often you should have regular mammograms. Talk with your health care provider about your test results, treatment options, and if necessary, the need for more tests. Vaccines  Your health care provider may recommend certain vaccines, such as:  Influenza vaccine. This is recommended every year.  Tetanus, diphtheria, and acellular pertussis (Tdap, Td) vaccine. You may need a Td booster every 10 years.  Zoster vaccine. You may need this after age 58.  Pneumococcal 13-valent conjugate (PCV13) vaccine. One dose is recommended after age 26.  Pneumococcal polysaccharide (PPSV23) vaccine. One dose is recommended after age 59. Talk to your health care provider about which screenings and vaccines you need and how often you need them. This information is not intended to replace advice given to you by your health care provider. Make sure you discuss any questions you have with your health care provider. Document Released: 10/02/2015 Document Revised: 05/25/2016 Document Reviewed: 07/07/2015 Elsevier Interactive Patient Education  2017 [place] Prevention in the Home Falls can cause injuries. They can happen to people of all ages. There are many things you can do to make your home safe and to help prevent falls. What can I do on the outside of my home?  Regularly fix the edges of walkways and driveways and fix any cracks.  Remove anything that might make you trip as you walk through a door, such as a raised step or threshold.  Trim any bushes or trees  on the path to your home.  Use bright outdoor lighting.  Clear any walking paths of anything that might make someone trip, such as rocks or tools.  Regularly check to see if handrails are loose or broken. Make sure that both sides of any steps have handrails.  Any raised decks and porches should have guardrails on the edges.  Have any leaves, snow, or ice cleared regularly.  Use sand or salt on walking paths during winter.  Clean up any spills in your garage right away. This includes oil or grease spills. What can I do in the bathroom?  Use night lights.  Install grab bars by the toilet and in the tub and shower. Do not use towel bars as grab bars.  Use non-skid mats or decals in the tub or shower.  If you need to sit down in the shower, use a plastic, non-slip stool.  Keep the floor dry. Clean up any water that spills on the floor as soon as it happens.  Remove soap buildup in the tub or shower regularly.  Attach bath mats securely with double-sided non-slip rug tape.  Do not have throw rugs and other things on the floor that can make you trip. What can I do in the bedroom?  Use night lights.  Make sure that you have a light by your bed that is easy to reach.  Do not use any sheets or blankets that are too big for your bed.  They should not hang down onto the floor.  Have a firm chair that has side arms. You can use this for support while you get dressed.  Do not have throw rugs and other things on the floor that can make you trip. What can I do in the kitchen?  Clean up any spills right away.  Avoid walking on wet floors.  Keep items that you use a lot in easy-to-reach places.  If you need to reach something above you, use a strong step stool that has a grab bar.  Keep electrical cords out of the way.  Do not use floor polish or wax that makes floors slippery. If you must use wax, use non-skid floor wax.  Do not have throw rugs and other things on the floor  that can make you trip. What can I do with my stairs?  Do not leave any items on the stairs.  Make sure that there are handrails on both sides of the stairs and use them. Fix handrails that are broken or loose. Make sure that handrails are as long as the stairways.  Check any carpeting to make sure that it is firmly attached to the stairs. Fix any carpet that is loose or worn.  Avoid having throw rugs at the top or bottom of the stairs. If you do have throw rugs, attach them to the floor with carpet tape.  Make sure that you have a light switch at the top of the stairs and the bottom of the stairs. If you do not have them, ask someone to add them for you. What else can I do to help prevent falls?  Wear shoes that:  Do not have high heels.  Have rubber bottoms.  Are comfortable and fit you well.  Are closed at the toe. Do not wear sandals.  If you use a stepladder:  Make sure that it is fully opened. Do not climb a closed stepladder.  Make sure that both sides of the stepladder are locked into place.  Ask someone to hold it for you, if possible.  Clearly mark and make sure that you can see:  Any grab bars or handrails.  First and last steps.  Where the edge of each step is.  Use tools that help you move around (mobility aids) if they are needed. These include:  Canes.  Walkers.  Scooters.  Crutches.  Turn on the lights when you go into a dark area. Replace any light bulbs as soon as they burn out.  Set up your furniture so you have a clear path. Avoid moving your furniture around.  If any of your floors are uneven, fix them.  If there are any pets around you, be aware of where they are.  Review your medicines with your doctor. Some medicines can make you feel dizzy. This can increase your chance of falling. Ask your doctor what other things that you can do to help prevent falls. This information is not intended to replace advice given to you by your health  care provider. Make sure you discuss any questions you have with your health care provider. Document Released: 07/02/2009 Document Revised: 02/11/2016 Document Reviewed: 10/10/2014 Elsevier Interactive Patient Education  2017 [employer].

## 2021-03-03 DIAGNOSIS — L814 Other melanin hyperpigmentation: Secondary | ICD-10-CM | POA: Diagnosis not present

## 2021-03-03 DIAGNOSIS — L578 Other skin changes due to chronic exposure to nonionizing radiation: Secondary | ICD-10-CM | POA: Diagnosis not present

## 2021-03-15 DIAGNOSIS — H2513 Age-related nuclear cataract, bilateral: Secondary | ICD-10-CM | POA: Diagnosis not present

## 2021-03-15 DIAGNOSIS — H04123 Dry eye syndrome of bilateral lacrimal glands: Secondary | ICD-10-CM | POA: Diagnosis not present

## 2021-10-20 ENCOUNTER — Encounter: Payer: Self-pay | Admitting: Internal Medicine

## 2021-10-20 DIAGNOSIS — Z20822 Contact with and (suspected) exposure to covid-19: Secondary | ICD-10-CM | POA: Diagnosis not present

## 2021-10-20 MED ORDER — MOLNUPIRAVIR EUA 200MG CAPSULE: 4.0000 | ORAL_CAPSULE | Freq: Two times a day (BID) | ORAL | 0 refills | Status: AC

## 2021-11-29 ENCOUNTER — Other Ambulatory Visit: Payer: Self-pay | Admitting: Internal Medicine

## 2021-11-29 DIAGNOSIS — Z1231 Encounter for screening mammogram for malignant neoplasm of breast: Secondary | ICD-10-CM

## 2021-11-30 ENCOUNTER — Ambulatory Visit
Admission: RE | Admit: 2021-11-30 | Discharge: 2021-11-30 | Disposition: A | Discharge status: Home / Self Care | Payer: Medicare PPO | Source: Ambulatory Visit | Attending: Internal Medicine | Admitting: Internal Medicine

## 2021-11-30 DIAGNOSIS — Z1231 Encounter for screening mammogram for malignant neoplasm of breast: Secondary | ICD-10-CM

## 2022-01-12 ENCOUNTER — Telehealth: Payer: Self-pay | Admitting: Internal Medicine

## 2022-01-12 NOTE — Telephone Encounter (Signed)
LVM for pt to rtn my call to schedule awv with nha. Please schedule appts if pt calls the office.  ?

## 2022-02-16 DIAGNOSIS — L570 Actinic keratosis: Secondary | ICD-10-CM | POA: Diagnosis not present

## 2022-02-16 DIAGNOSIS — D225 Melanocytic nevi of trunk: Secondary | ICD-10-CM | POA: Diagnosis not present

## 2022-02-16 DIAGNOSIS — I788 Other diseases of capillaries: Secondary | ICD-10-CM | POA: Diagnosis not present

## 2022-02-16 DIAGNOSIS — L821 Other seborrheic keratosis: Secondary | ICD-10-CM | POA: Diagnosis not present

## 2022-02-16 DIAGNOSIS — D2262 Melanocytic nevi of left upper limb, including shoulder: Secondary | ICD-10-CM | POA: Diagnosis not present

## 2022-02-16 DIAGNOSIS — D2261 Melanocytic nevi of right upper limb, including shoulder: Secondary | ICD-10-CM | POA: Diagnosis not present

## 2022-02-16 DIAGNOSIS — D1801 Hemangioma of skin and subcutaneous tissue: Secondary | ICD-10-CM | POA: Diagnosis not present

## 2022-04-13 ENCOUNTER — Ambulatory Visit (INDEPENDENT_AMBULATORY_CARE_PROVIDER_SITE_OTHER): Payer: Medicare PPO | Admitting: Internal Medicine

## 2022-04-13 ENCOUNTER — Encounter: Payer: Self-pay | Admitting: Internal Medicine

## 2022-04-13 VITALS — BP 118/64 | HR 67 | Resp 18 | Ht 66.0 in | Wt 156.6 lb

## 2022-04-13 DIAGNOSIS — Z0000 Encounter for general adult medical examination without abnormal findings: Secondary | ICD-10-CM | POA: Diagnosis not present

## 2022-04-13 DIAGNOSIS — Z853 Personal history of malignant neoplasm of breast: Secondary | ICD-10-CM

## 2022-04-13 DIAGNOSIS — E2839 Other primary ovarian failure: Secondary | ICD-10-CM

## 2022-04-13 LAB — CBC
HCT: 44.9 % (ref 36.0–46.0)
Hemoglobin: 15.1 g/dL — ABNORMAL HIGH (ref 12.0–15.0)
MCHC: 33.7 g/dL (ref 30.0–36.0)
MCV: 96.2 fl (ref 78.0–100.0)
Platelets: 272 10*3/uL (ref 150.0–400.0)
RBC: 4.67 Mil/uL (ref 3.87–5.11)
RDW: 12.9 % (ref 11.5–15.5)
WBC: 4.3 10*3/uL (ref 4.0–10.5)

## 2022-04-13 LAB — LIPID PANEL
Cholesterol: 200 mg/dL (ref 0–200)
HDL: 84.5 mg/dL (ref >39.00)
LDL Cholesterol: 104 mg/dL — ABNORMAL HIGH (ref 0–99)
NonHDL: 115.62
Total CHOL/HDL Ratio: 2
Triglycerides: 58 mg/dL (ref 0.0–149.0)
VLDL: 11.6 mg/dL (ref 0.0–40.0)

## 2022-04-13 LAB — COMPREHENSIVE METABOLIC PANEL
ALT: 12 U/L (ref 0–35)
AST: 16 U/L (ref 0–37)
Albumin: 4.5 g/dL (ref 3.5–5.2)
Alkaline Phosphatase: 54 U/L (ref 39–117)
BUN: 21 mg/dL (ref 6–23)
CO2: 29 mEq/L (ref 19–32)
Calcium: 9.4 mg/dL (ref 8.4–10.5)
Chloride: 101 mEq/L (ref 96–112)
Creatinine, Ser: 0.83 mg/dL (ref 0.40–1.20)
GFR: 70.23 mL/min (ref >60.00)
Glucose, Bld: 93 mg/dL (ref 70–99)
Potassium: 4.7 mEq/L (ref 3.5–5.1)
Sodium: 137 mEq/L (ref 135–145)
Total Bilirubin: 0.8 mg/dL (ref 0.2–1.2)
Total Protein: 7.3 g/dL (ref 6.0–8.3)

## 2022-04-13 MED ORDER — TRIAMCINOLONE ACETONIDE 0.1 % EX CREA: 1.0000 | TOPICAL_CREAM | Freq: Two times a day (BID) | CUTANEOUS | 11 refills | Status: DC

## 2022-04-13 NOTE — Patient Instructions (Signed)
We have done the EKG which is normal. We are checking the labs today.  We will get the bone density test done.

## 2022-04-13 NOTE — Progress Notes (Signed)
Subjective:   Patient ID: Abigail Watkins, female    DOB: 1950/10/05, 71 y.o.   MRN: [id number]  HPI Here for medicare wellness and physical, no new complaints. Please see A/P for status and treatment of chronic medical problems.   Diet: heart healthy Physical activity: sedentary Depression/mood screen: negative Hearing: intact to whispered voice Visual acuity: grossly normal with lens, performs annual eye exam  ADLs: capable Fall risk: none Home safety: good Cognitive evaluation: intact to orientation, naming, recall and repetition EOL planning: adv directives discussed  [place] Visit from 04/13/2022 in [place] at [facility]  PHQ-2 Total Score [place] Office Visit from 04/13/2022 in [place] at [facility]  PHQ-9 Total Score 0         07/26/2016   10:09 AM 08/07/2018   10:51 AM 09/05/2019    9:41 AM 01/15/2021    1:50 PM 04/13/2022   11:01 AM  [place] in the past year? No 0 0 0 0  Was there an injury with Fall?    0 0  Fall Risk Category Calculator    0 0  Fall Risk Category    Low Low  Patient Fall Risk Level   Low fall risk Low fall risk   Patient at Risk for Falls Due to    No Fall Risks   Fall risk Follow up    Falls evaluation completed     I have personally reviewed and have noted 1. The patient's medical and social history - reviewed today no changes 2. Their use of alcohol, tobacco or illicit drugs 3. Their current medications and supplements 4. The patient's functional ability including ADL's, fall risks, home safety risks and hearing or visual impairment. 5. Diet and physical activities 6. Evidence for depression or mood disorders 7. Care team reviewed and updated 8.  The patient is not on an opioid pain medication.  Patient Care Team: Hoyt Koch, MD as PCP - General (Internal Medicine) Camillo Flaming, [place] as Referring Physician (Optometry) Past Medical History:  Diagnosis Date    Allergy    Breast cancer [facility])    Cancer [facility]) 2004   breast   Past Surgical History:  Procedure Laterality Date   BREAST BIOPSY Left 1995   breast biospy Right 2003   BREAST SURGERY Right 2003   MASTECTOMY Right    2004   TONSILLECTOMY AND ADENOIDECTOMY  1958   TUBAL LIGATION  1989   Family History  Problem Relation Age of Onset   Cancer Maternal Grandmother    Breast cancer Paternal Grandmother 3   Colon cancer Neg Hx    Review of Systems  Constitutional: Negative.   HENT: Negative.    Eyes: Negative.   Respiratory:  Negative for cough, chest tightness and shortness of breath.   Cardiovascular:  Negative for chest pain, palpitations and leg swelling.  Gastrointestinal:  Negative for abdominal distention, abdominal pain, constipation, diarrhea, nausea and vomiting.  Musculoskeletal: Negative.   Skin: Negative.   Neurological: Negative.   Psychiatric/Behavioral: Negative.      Objective:  Physical Exam Constitutional:      Appearance: She is well-developed.  HENT:     Head: Normocephalic and atraumatic.  Cardiovascular:     Rate and Rhythm: Normal rate and regular rhythm.  Pulmonary:     Effort: Pulmonary effort is normal. No respiratory distress.     Breath sounds: Normal breath sounds. No wheezing or  rales.  Abdominal:     General: Bowel sounds are normal. There is no distension.     Palpations: Abdomen is soft.     Tenderness: There is no abdominal tenderness. There is no rebound.  Musculoskeletal:     Cervical back: Normal range of motion.  Skin:    General: Skin is warm and dry.  Neurological:     Mental Status: She is alert and oriented to person, place, and time.     Coordination: Coordination normal.    EKG: Rate 60, axis normal, interval normal, sinus, no st or t wave changes, no significant change compared to prior 2015   Vitals:   04/13/22 1055  BP: 118/64  Pulse: 67  Resp: 18  SpO2: 98%  Weight: 156 lb 9.6 oz (71 kg)  Height: '5\' 6"'$   (1.676 m)    Assessment & Plan:

## 2022-04-13 NOTE — Assessment & Plan Note (Signed)
Flu shot yearly. Covid-19 counseled. Pneumonia complete. Shingrix complete. Tetanus due 2026. Colonoscopy due 2025. Mammogram due 2024, pap smear aged out and dexa due ordered today. Counseled about sun safety and mole surveillance. Counseled about the dangers of distracted driving. Given 10 year screening recommendations.

## 2022-04-13 NOTE — Assessment & Plan Note (Signed)
Checking CBC and CMP and updated EKG which is normal today. Continue yearly mammogram and no new clinical symptoms.

## 2022-04-20 ENCOUNTER — Ambulatory Visit (INDEPENDENT_AMBULATORY_CARE_PROVIDER_SITE_OTHER)
Admission: RE | Admit: 2022-04-20 | Discharge: 2022-04-20 | Disposition: A | Discharge status: Home / Self Care | Payer: Medicare PPO | Source: Ambulatory Visit | Attending: Internal Medicine | Admitting: Internal Medicine

## 2022-04-20 DIAGNOSIS — E2839 Other primary ovarian failure: Secondary | ICD-10-CM | POA: Diagnosis not present

## 2022-05-09 DIAGNOSIS — H16223 Keratoconjunctivitis sicca, not specified as Sjogren's, bilateral: Secondary | ICD-10-CM | POA: Diagnosis not present

## 2022-05-09 DIAGNOSIS — H25012 Cortical age-related cataract, left eye: Secondary | ICD-10-CM | POA: Diagnosis not present

## 2022-05-09 DIAGNOSIS — H2513 Age-related nuclear cataract, bilateral: Secondary | ICD-10-CM | POA: Diagnosis not present

## 2022-09-30 ENCOUNTER — Telehealth: Payer: Medicare PPO | Admitting: Physician Assistant

## 2022-09-30 ENCOUNTER — Encounter: Payer: Self-pay | Admitting: Physician Assistant

## 2022-09-30 DIAGNOSIS — K121 Other forms of stomatitis: Secondary | ICD-10-CM

## 2022-09-30 DIAGNOSIS — J029 Acute pharyngitis, unspecified: Secondary | ICD-10-CM

## 2022-09-30 NOTE — Progress Notes (Unsigned)
.  esorethr

## 2022-09-30 NOTE — Progress Notes (Unsigned)

## 2022-11-09 ENCOUNTER — Other Ambulatory Visit: Payer: Self-pay | Admitting: Internal Medicine

## 2022-11-09 DIAGNOSIS — Z1231 Encounter for screening mammogram for malignant neoplasm of breast: Secondary | ICD-10-CM

## 2022-12-27 ENCOUNTER — Ambulatory Visit
Admission: RE | Admit: 2022-12-27 | Discharge: 2022-12-27 | Disposition: A | Discharge status: Home / Self Care | Payer: Medicare PPO | Source: Ambulatory Visit | Attending: Internal Medicine | Admitting: Internal Medicine

## 2022-12-27 DIAGNOSIS — Z1231 Encounter for screening mammogram for malignant neoplasm of breast: Secondary | ICD-10-CM

## 2022-12-28 LAB — HM MAMMOGRAPHY

## 2022-12-29 ENCOUNTER — Encounter: Payer: Self-pay | Admitting: Internal Medicine

## 2023-02-23 DIAGNOSIS — D1801 Hemangioma of skin and subcutaneous tissue: Secondary | ICD-10-CM | POA: Diagnosis not present

## 2023-02-23 DIAGNOSIS — L821 Other seborrheic keratosis: Secondary | ICD-10-CM | POA: Diagnosis not present

## 2023-02-23 DIAGNOSIS — L570 Actinic keratosis: Secondary | ICD-10-CM | POA: Diagnosis not present

## 2023-02-23 DIAGNOSIS — L814 Other melanin hyperpigmentation: Secondary | ICD-10-CM | POA: Diagnosis not present

## 2023-02-23 DIAGNOSIS — D692 Other nonthrombocytopenic purpura: Secondary | ICD-10-CM | POA: Diagnosis not present

## 2023-02-23 DIAGNOSIS — I788 Other diseases of capillaries: Secondary | ICD-10-CM | POA: Diagnosis not present

## 2023-04-19 ENCOUNTER — Encounter: Payer: Medicare PPO | Admitting: Internal Medicine

## 2023-04-21 ENCOUNTER — Encounter: Payer: Self-pay | Admitting: Internal Medicine

## 2023-04-21 ENCOUNTER — Ambulatory Visit (INDEPENDENT_AMBULATORY_CARE_PROVIDER_SITE_OTHER): Payer: Medicare PPO | Admitting: Internal Medicine

## 2023-04-21 VITALS — BP 139/89 | HR 62 | Temp 97.8°F | Ht 66.0 in | Wt 159.0 lb

## 2023-04-21 DIAGNOSIS — Z0000 Encounter for general adult medical examination without abnormal findings: Secondary | ICD-10-CM | POA: Diagnosis not present

## 2023-04-21 DIAGNOSIS — Z853 Personal history of malignant neoplasm of breast: Secondary | ICD-10-CM

## 2023-04-21 LAB — LIPID PANEL
Cholesterol: 171 mg/dL (ref 0–200)
HDL: 78.6 mg/dL (ref >39.00)
LDL Cholesterol: 79 mg/dL (ref 0–99)
NonHDL: 92.26
Total CHOL/HDL Ratio: 2
Triglycerides: 67 mg/dL (ref 0.0–149.0)
VLDL: 13.4 mg/dL (ref 0.0–40.0)

## 2023-04-21 LAB — COMPREHENSIVE METABOLIC PANEL
ALT: 15 U/L (ref 0–35)
AST: 17 U/L (ref 0–37)
Albumin: 4.2 g/dL (ref 3.5–5.2)
Alkaline Phosphatase: 46 U/L (ref 39–117)
BUN: 14 mg/dL (ref 6–23)
CO2: 30 mEq/L (ref 19–32)
Calcium: 8.9 mg/dL (ref 8.4–10.5)
Chloride: 103 mEq/L (ref 96–112)
Creatinine, Ser: 0.7 mg/dL (ref 0.40–1.20)
GFR: 85.54 mL/min (ref >60.00)
Glucose, Bld: 93 mg/dL (ref 70–99)
Potassium: 4.4 mEq/L (ref 3.5–5.1)
Sodium: 139 mEq/L (ref 135–145)
Total Bilirubin: 0.5 mg/dL (ref 0.2–1.2)
Total Protein: 6.5 g/dL (ref 6.0–8.3)

## 2023-04-21 LAB — CBC
HCT: 43.5 % (ref 36.0–46.0)
Hemoglobin: 14.4 g/dL (ref 12.0–15.0)
MCHC: 33.1 g/dL (ref 30.0–36.0)
MCV: 96.5 fl (ref 78.0–100.0)
Platelets: 273 10*3/uL (ref 150.0–400.0)
RBC: 4.51 Mil/uL (ref 3.87–5.11)
RDW: 12.5 % (ref 11.5–15.5)
WBC: 4.2 10*3/uL (ref 4.0–10.5)

## 2023-04-21 MED ORDER — TRIAMCINOLONE ACETONIDE 0.1 % EX CREA: 1.0000 | TOPICAL_CREAM | Freq: Two times a day (BID) | CUTANEOUS | 11 refills | Status: DC

## 2023-04-21 NOTE — Assessment & Plan Note (Signed)
Flu shot yearly. Pneumonia complete. Shingrix complete. Tetanus due 2026. Colonoscopy due 2025. Mammogram due 2025, pap smear aged out and dexa complete. Counseled about sun safety and mole surveillance. Counseled about the dangers of distracted driving. Given 10 year screening recommendations.

## 2023-04-21 NOTE — Progress Notes (Signed)
Subjective:   Patient ID: Abigail Watkins, female    DOB: Oct 26, 1950, 72 y.o.   MRN: [id number]  HPI Here for medicare wellness and physical, no new complaints. Please see A/P for status and treatment of chronic medical problems.   Diet: heart healthy Physical activity: walks regular Depression/mood screen: negative Hearing: intact to whispered voice Visual acuity: grossly normal with lens, performs annual eye exam  ADLs: capable Fall risk: none Home safety: good Cognitive evaluation: intact to orientation, naming, recall and repetition EOL planning: adv directives discussed  [place] Office Visit from [facility]21/2023 in [facility] at [place]  PHQ-2 Total Score 0       Flowsheet Row Office Visit from 04/21/2023 in [facility] at [place]  PHQ-9 Total Score 0         08/07/2018   10:51 AM 09/05/2019    9:41 AM 01/15/2021    1:50 PM 04/13/2022   11:01 AM 04/21/2023    9:17 AM  Fall Risk  Falls in the past year? 0 0 0 0 0  Was there an injury with Fall?   0 0 0  Fall Risk Category Calculator   0 0 0  Fall Risk Category (Retired)   Low Low   (RETIRED) Patient Fall Risk Level  Low fall risk Low fall risk    Patient at Risk for Falls Due to   No Fall Risks    Fall risk Follow up   Falls evaluation completed  Falls evaluation completed    I have personally reviewed and have noted 1. The patient's medical and social history - reviewed today no changes 2. Their use of alcohol, tobacco or illicit drugs 3. Their current medications and supplements 4. The patient's functional ability including ADL's, fall risks, home safety risks and hearing or visual impairment. 5. Diet and physical activities 6. Evidence for depression or mood disorders 7. Care team reviewed and updated 8.  The patient is not on an opioid pain medication.  Patient Care Team: Myrlene Broker, MD as PCP - General (Internal Medicine) Gelene Mink, OD as  Referring Physician (Optometry) Past Medical History:  Diagnosis Date   Allergy    Breast cancer [facility])    Cancer [facility]) 2004   breast   Past Surgical History:  Procedure Laterality Date   BREAST BIOPSY Left 1995   breast biospy Right 2003   BREAST SURGERY Right 2003   MASTECTOMY Right    2004   TONSILLECTOMY AND ADENOIDECTOMY  1958   TUBAL LIGATION  1989   Family History  Problem Relation Age of Onset   Multiple myeloma Brother    Cancer Maternal Grandmother    Breast cancer Paternal Grandmother 69   Colon cancer Neg Hx    Review of Systems  Constitutional: Negative.   HENT: Negative.    Eyes: Negative.   Respiratory:  Negative for cough, chest tightness and shortness of breath.   Cardiovascular:  Negative for chest pain, palpitations and leg swelling.  Gastrointestinal:  Negative for abdominal distention, abdominal pain, constipation, diarrhea, nausea and vomiting.  Musculoskeletal: Negative.   Skin: Negative.   Neurological: Negative.   Psychiatric/Behavioral: Negative.     Objective:  Physical Exam Constitutional:      Appearance: She is well-developed.  HENT:     Head: Normocephalic and atraumatic.  Cardiovascular:     Rate and Rhythm: Normal rate and regular rhythm.  Pulmonary:     Effort: Pulmonary effort is normal.  No respiratory distress.     Breath sounds: Normal breath sounds. No wheezing or rales.  Abdominal:     General: Bowel sounds are normal. There is no distension.     Palpations: Abdomen is soft.     Tenderness: There is no abdominal tenderness. There is no rebound.  Musculoskeletal:     Cervical back: Normal range of motion.  Skin:    General: Skin is warm and dry.  Neurological:     Mental Status: She is alert and oriented to person, place, and time.     Coordination: Coordination normal.     Vitals:   04/21/23 0918 04/21/23 0921 04/21/23 0945  BP: (!) 140/100 (!) 140/100 139/89  Pulse: 62    Temp: 97.8 F (36.6 C)    TempSrc: Oral     SpO2: 99%    Weight: 159 lb (72.1 kg)    Height: 5\' 6"  (1.676 m)      Assessment & Plan:

## 2023-04-21 NOTE — Assessment & Plan Note (Signed)
Continues yearly mammogram which is up to date.

## 2023-04-21 NOTE — Patient Instructions (Signed)
Get the flu and RSV.

## 2023-06-20 DIAGNOSIS — H25012 Cortical age-related cataract, left eye: Secondary | ICD-10-CM | POA: Diagnosis not present

## 2023-06-20 DIAGNOSIS — H16223 Keratoconjunctivitis sicca, not specified as Sjogren's, bilateral: Secondary | ICD-10-CM | POA: Diagnosis not present

## 2023-06-20 DIAGNOSIS — H0288B Meibomian gland dysfunction left eye, upper and lower eyelids: Secondary | ICD-10-CM | POA: Diagnosis not present

## 2023-06-20 DIAGNOSIS — H2513 Age-related nuclear cataract, bilateral: Secondary | ICD-10-CM | POA: Diagnosis not present

## 2023-12-01 ENCOUNTER — Other Ambulatory Visit: Payer: Self-pay | Admitting: Internal Medicine

## 2023-12-01 DIAGNOSIS — Z1231 Encounter for screening mammogram for malignant neoplasm of breast: Secondary | ICD-10-CM

## 2023-12-29 ENCOUNTER — Ambulatory Visit

## 2024-01-02 ENCOUNTER — Ambulatory Visit
Admission: RE | Admit: 2024-01-02 | Discharge: 2024-01-02 | Disposition: A | Discharge status: Home / Self Care | Source: Ambulatory Visit | Attending: Internal Medicine | Admitting: Internal Medicine

## 2024-01-02 DIAGNOSIS — Z1231 Encounter for screening mammogram for malignant neoplasm of breast: Secondary | ICD-10-CM

## 2024-01-03 ENCOUNTER — Ambulatory Visit

## 2024-01-10 ENCOUNTER — Encounter: Payer: Self-pay | Admitting: Internal Medicine

## 2024-01-10 LAB — HM MAMMOGRAPHY

## 2024-03-11 DIAGNOSIS — H0288B Meibomian gland dysfunction left eye, upper and lower eyelids: Secondary | ICD-10-CM | POA: Diagnosis not present

## 2024-03-11 DIAGNOSIS — H16223 Keratoconjunctivitis sicca, not specified as Sjogren's, bilateral: Secondary | ICD-10-CM | POA: Diagnosis not present

## 2024-03-11 DIAGNOSIS — H0288A Meibomian gland dysfunction right eye, upper and lower eyelids: Secondary | ICD-10-CM | POA: Diagnosis not present

## 2024-04-02 DIAGNOSIS — L821 Other seborrheic keratosis: Secondary | ICD-10-CM | POA: Diagnosis not present

## 2024-04-02 DIAGNOSIS — I788 Other diseases of capillaries: Secondary | ICD-10-CM | POA: Diagnosis not present

## 2024-04-02 DIAGNOSIS — L814 Other melanin hyperpigmentation: Secondary | ICD-10-CM | POA: Diagnosis not present

## 2024-04-02 DIAGNOSIS — L810 Postinflammatory hyperpigmentation: Secondary | ICD-10-CM | POA: Diagnosis not present

## 2024-04-02 DIAGNOSIS — D1801 Hemangioma of skin and subcutaneous tissue: Secondary | ICD-10-CM | POA: Diagnosis not present

## 2024-04-24 ENCOUNTER — Ambulatory Visit (INDEPENDENT_AMBULATORY_CARE_PROVIDER_SITE_OTHER)

## 2024-04-24 ENCOUNTER — Encounter: Payer: Self-pay | Admitting: Internal Medicine

## 2024-04-24 ENCOUNTER — Ambulatory Visit (INDEPENDENT_AMBULATORY_CARE_PROVIDER_SITE_OTHER): Payer: Medicare PPO | Admitting: Internal Medicine

## 2024-04-24 VITALS — Ht 66.0 in | Wt 159.0 lb

## 2024-04-24 VITALS — BP 180/100 | HR 65 | Temp 98.0°F | Ht 66.0 in | Wt 159.0 lb

## 2024-04-24 DIAGNOSIS — Z0000 Encounter for general adult medical examination without abnormal findings: Secondary | ICD-10-CM

## 2024-04-24 DIAGNOSIS — Z853 Personal history of malignant neoplasm of breast: Secondary | ICD-10-CM | POA: Diagnosis not present

## 2024-04-24 DIAGNOSIS — Z13220 Encounter for screening for lipoid disorders: Secondary | ICD-10-CM | POA: Diagnosis not present

## 2024-04-24 DIAGNOSIS — Z1211 Encounter for screening for malignant neoplasm of colon: Secondary | ICD-10-CM

## 2024-04-24 LAB — LIPID PANEL
Cholesterol: 189 mg/dL (ref 0–200)
HDL: 80.9 mg/dL (ref >39.00)
LDL Cholesterol: 95 mg/dL (ref 0–99)
NonHDL: 107.62
Total CHOL/HDL Ratio: 2
Triglycerides: 63 mg/dL (ref 0.0–149.0)
VLDL: 12.6 mg/dL (ref 0.0–40.0)

## 2024-04-24 LAB — CBC
HCT: 44.9 % (ref 36.0–46.0)
Hemoglobin: 14.9 g/dL (ref 12.0–15.0)
MCHC: 33.2 g/dL (ref 30.0–36.0)
MCV: 96.1 fl (ref 78.0–100.0)
Platelets: 261 K/uL (ref 150.0–400.0)
RBC: 4.68 Mil/uL (ref 3.87–5.11)
RDW: 13 % (ref 11.5–15.5)
WBC: 4.4 K/uL (ref 4.0–10.5)

## 2024-04-24 LAB — COMPREHENSIVE METABOLIC PANEL WITH GFR
ALT: 15 U/L (ref 0–35)
AST: 17 U/L (ref 0–37)
Albumin: 4.5 g/dL (ref 3.5–5.2)
Alkaline Phosphatase: 47 U/L (ref 39–117)
BUN: 22 mg/dL (ref 6–23)
CO2: 32 meq/L (ref 19–32)
Calcium: 9.2 mg/dL (ref 8.4–10.5)
Chloride: 103 meq/L (ref 96–112)
Creatinine, Ser: 0.86 mg/dL (ref 0.40–1.20)
GFR: 66.35 mL/min (ref >60.00)
Glucose, Bld: 88 mg/dL (ref 70–99)
Potassium: 4.5 meq/L (ref 3.5–5.1)
Sodium: 140 meq/L (ref 135–145)
Total Bilirubin: 0.7 mg/dL (ref 0.2–1.2)
Total Protein: 6.9 g/dL (ref 6.0–8.3)

## 2024-04-24 MED ORDER — TRIAMCINOLONE ACETONIDE 0.1 % EX CREA: 1.0000 | TOPICAL_CREAM | Freq: Two times a day (BID) | CUTANEOUS | 11 refills | Status: AC

## 2024-04-24 NOTE — Patient Instructions (Signed)
 Abigail Watkins , Thank you for taking time out of your busy schedule to complete your Annual Wellness Visit with me. I enjoyed our conversation and look forward to speaking with you again next year. I, as well as your care team,  appreciate your ongoing commitment to your health goals. Please review the following plan we discussed and let me know if I can assist you in the future. Your Game plan/ To Do List    Referrals: If you haven't heard from the office you've been referred to, please reach out to them at the phone provided.   Follow up Visits: We will see or speak with you next year for your Next Medicare AWV with our clinical staff Have you seen your provider in the last 6 months (3 months if uncontrolled diabetes)? No  Clinician Recommendations:  Aim for 30 minutes of exercise or brisk walking, 6-8 glasses of water, and 5 servings of fruits and vegetables each day. You are due for a colonoscopy.  Keep up the good work.      This is a list of the screenings recommended for you:  Health Maintenance  Topic Date Due   COVID-19 Vaccine (6 - 2024-25 season) 05/21/2023   Colon Cancer Screening  01/10/2024   Medicare Annual Wellness Visit  04/20/2024   Flu Shot  04/19/2024   DTaP/Tdap/Td vaccine (2 - Td or Tdap) 04/15/2025   Mammogram  01/09/2026   Pneumococcal Vaccine for age over 69  Completed   DEXA scan (bone density measurement)  Completed   Hepatitis C Screening  Completed   Zoster (Shingles) Vaccine  Completed   Hepatitis B Vaccine  Aged Out   HPV Vaccine  Aged Out   Meningitis B Vaccine  Aged Out    Advanced directives: (Copy Requested) Please bring a copy of your health care power of attorney and living will to the office to be added to your chart at your convenience. You can mail to [facility] [address] [place], [place] [address] or email to [email or web address] Advance Care Planning is important because it:  [x]  Makes sure you receive the medical  care that is consistent with your values, goals, and preferences  [x]  It provides guidance to your family and loved ones and reduces their decisional burden about whether or not they are making the right decisions based on your wishes.  Follow the link provided in your after visit summary or read over the paperwork we have mailed to you to help you started getting your Advance Directives in place. If you need assistance in completing these, please reach out to us  so that we can help you!  See attachments for Preventive Care and Fall Prevention Tips.

## 2024-04-24 NOTE — Progress Notes (Signed)
 Subjective:   Abigail Watkins is a 73 y.o. who presents for a Medicare Wellness preventive visit.  As a reminder, Annual Wellness Visits don't include a physical exam, and some assessments may be limited, especially if this visit is performed virtually. We may recommend an in-person follow-up visit with your provider if needed.  Visit Complete: Virtual I connected with  Abigail Watkins on 04/24/24 by a audio enabled telemedicine application and verified that I am speaking with the correct person using two identifiers.  Patient Location: Home  Provider Location: Home Office  I discussed the limitations of evaluation and management by telemedicine. The patient expressed understanding and agreed to proceed.  Vital Signs: Because this visit was a virtual/telehealth visit, some criteria may be missing or patient reported. Any vitals not documented were not able to be obtained and vitals that have been documented are patient reported.  VideoDeclined- This patient declined Librarian, academic. Therefore the visit was completed with audio only.  Persons Participating in Visit: Patient.  AWV Questionnaire: Yes: Patient Medicare AWV questionnaire was completed by the patient on 04/24/2024; I have confirmed that all information answered by patient is correct and no changes since this date.  Cardiac Risk Factors include: advanced age (>24men, >40 women)     Objective:    Today's Vitals   04/24/24 0829  Weight: 159 lb (72.1 kg)  Height: 5' 6 (1.676 m)   Body mass index is 25.66 kg/m.     04/24/2024    8:48 AM 01/15/2021    1:33 PM 09/05/2019   10:26 AM 07/10/2017    3:08 PM  Advanced Directives  Does Patient Have a Medical Advance Directive? Yes Yes Yes Yes   Type of Estate agent of [place];Living will Living will;Healthcare Power of [employer] Power of Attorney   Does patient want to make changes to medical advance directive?  No  - Patient declined    Copy of Healthcare Power of Attorney in Chart? No - copy requested No - copy requested No - copy requested      Data saved with a previous flowsheet row definition    Current Medications (verified) Outpatient Encounter Medications as of 04/24/2024  Medication Sig   CEQUA 0.09 % SOLN Apply 1 drop to eye 2 (two) times daily.   triamcinolone  cream (KENALOG ) 0.1 % Apply 1 Application topically 2 (two) times daily.   No facility-administered encounter medications on file as of 04/24/2024.    Allergies (verified) Patient has no known allergies.   History: Past Medical History:  Diagnosis Date   Allergy    Breast cancer (HCC)    Cancer (HCC) 2004   breast   Past Surgical History:  Procedure Laterality Date   BREAST BIOPSY Left 1995   breast biospy Right 2003   BREAST SURGERY Right 2003   MASTECTOMY Right    2004   TONSILLECTOMY AND ADENOIDECTOMY  1958   TUBAL LIGATION  1989   Family History  Problem Relation Age of Onset   Multiple myeloma Brother    Cancer Maternal Grandmother    Breast cancer Paternal Grandmother 75   Colon cancer Neg Hx    Social History   Socioeconomic History   Marital status: Married    Spouse name: Abigail Watkins   Number of children: 2   Years of education: Not on file   Highest education level: Bachelor's degree (e.g., BA, AB, BS)  Occupational History   Occupation: Retired  Tobacco Use  Smoking status: Former    Current packs/day: 0.00    Types: Cigarettes    Quit date: 09/19/1978    Years since quitting: 45.6   Smokeless tobacco: Never  Vaping Use   Vaping status: Never Used  Substance and Sexual Activity   Alcohol use: Yes    Alcohol/week: 5.0 standard drinks of alcohol    Types: 5 Glasses of wine per week   Drug use: No   Sexual activity: Yes  Other Topics Concern   Not on file  Social History Narrative   Lives with husband/2025   Social Drivers of Health   Financial Resource Strain: Low Risk  (04/24/2024)    Overall Financial Resource Strain (CARDIA)    Difficulty of Paying Living Expenses: Not hard at all  Food Insecurity: No Food Insecurity (04/24/2024)   Hunger Vital Sign    Worried About Running Out of Food in the Last Year: Never true    Ran Out of Food in the Last Year: Never true  Transportation Needs: No Transportation Needs (04/24/2024)   PRAPARE - Administrator, Civil Service (Medical): No    Lack of Transportation (Non-Medical): No  Physical Activity: Unknown (04/24/2024)   Exercise Vital Sign    Days of Exercise per Week: 3 days    Minutes of Exercise per Session: Not on file  Stress: No Stress Concern Present (04/24/2024)   [employer] of Occupational Health - Occupational Stress Questionnaire    Feeling of Stress: Not at all  Social Connections: Socially Integrated (04/24/2024)   Social Connection and Isolation Panel    Frequency of Communication with Friends and Family: More than three times a week    Frequency of Social Gatherings with Friends and Family: Once a week    Attends Religious Services: 1 to 4 times per year    Active Member of [employer] or Organizations: Yes    Attends Engineer, structural: More than 4 times per year    Marital Status: Married    Tobacco Counseling Counseling given: Not Answered    Clinical Intake:  Pre-visit preparation completed: Yes  Pain : No/denies pain     BMI - recorded: 25.66 Nutritional Status: BMI 25 -29 Overweight Nutritional Risks: None Diabetes: No  Lab Results  Component Value Date   HGBA1C 5.5 08/07/2018     How often do you need to have someone help you when you read instructions, pamphlets, or other written materials from your doctor or pharmacy?: 1 - Never  Interpreter Needed?: No  Information entered by :: Abigail Watkins, RMA   Activities of Daily Living     04/24/2024    8:04 AM  In your present state of health, do you have any difficulty performing the following activities:  Hearing?  0  Vision? 0  Difficulty concentrating or making decisions? 0  Walking or climbing stairs? 0  Dressing or bathing? 0  Doing errands, shopping? 0  Preparing Food and eating ? N  Using the Toilet? N  In the past six months, have you accidently leaked urine? N  Do you have problems with loss of bowel control? N  Managing your Medications? N  Managing your Finances? N  Housekeeping or managing your Housekeeping? N    Patient Care Team: Rollene Almarie LABOR, MD as PCP - General (Internal Medicine) Lita Lye, OD as Referring Physician (Optometry)  I have updated your Care Teams any recent Medical Services you may have received from other providers in the past  year.     Assessment:   This is a routine wellness examination for Abigail Watkins.  Hearing/Vision screen Hearing Screening - Comments:: Denies hearing difficulties   Vision Screening - Comments:: Wears eyeglasses/Dr. Koop/Vision source   Goals Addressed             This Visit's Progress    Patient Stated   On track    Stay as fit as I can and not get over weight and stay flexible by doing yoga. Enjoy life and family.       Depression Screen     04/24/2024    8:50 AM 04/21/2023    9:18 AM 04/13/2022   11:01 AM 01/15/2021    1:50 PM 09/05/2019    9:41 AM 08/07/2018   10:51 AM 07/26/2016   10:09 AM  PHQ 2/9 Scores  PHQ - 2 Score 0 0 0 0 0 0 0  PHQ- 9 Score 0 0 0        Fall Risk     04/24/2024    8:04 AM 04/21/2023    9:17 AM 04/13/2022   11:01 AM 01/15/2021    1:50 PM 09/05/2019    9:41 AM  Fall Risk   Falls in the past year? 0 0 0 0 0   Number falls in past yr: 0 0 0 0   Injury with Fall?  0 0 0   Risk for fall due to :    No Fall Risks   Follow up Falls evaluation completed;Falls prevention discussed Falls evaluation completed  Falls evaluation completed       Data saved with a previous flowsheet row definition    MEDICARE RISK AT HOME:  Medicare Risk at Home Any stairs in or around the home?:  (Patient-Rptd) Yes If so, are there any without handrails?: (Patient-Rptd) No Home free of loose throw rugs in walkways, pet beds, electrical cords, etc?: (Patient-Rptd) No Adequate lighting in your home to reduce risk of falls?: (Patient-Rptd) Yes Life alert?: (Patient-Rptd) No Use of a cane, walker or w/c?: (Patient-Rptd) No Grab bars in the bathroom?: (Patient-Rptd) No Shower chair or bench in shower?: (Patient-Rptd) No Elevated toilet seat or a handicapped toilet?: (Patient-Rptd) Yes  TIMED UP AND GO:  Was the test performed?  No  Cognitive Function: Declined/Normal: No cognitive concerns noted by patient or family. Patient alert, oriented, able to answer questions appropriately and recall recent events. No signs of memory loss or confusion.        Immunizations Immunization History  Administered Date(s) Administered   Fluad Quad(high Dose 65+) 07/08/2022   Influenza, High Dose Seasonal PF 07/26/2016, 06/29/2017, 08/07/2018, 07/06/2019   Influenza,inj,Quad PF,6+ Mos 09/03/2015   Influenza-Unspecified 07/06/2019   Moderna SARS-COV2 Booster Vaccination 07/24/2020   Moderna Sars-Covid-2 Vaccination 10/19/2019, 11/18/2019, 07/24/2020   Pfizer Covid-19 Vaccine Bivalent Booster 107yrs & up 07/08/2022   Pneumococcal Conjugate-13 07/26/2016   Pneumococcal Polysaccharide-23 08/07/2018   Tdap 04/16/2015   Zoster Recombinant(Shingrix) 06/22/2019, 06/22/2019, 10/02/2019    Screening Tests Health Maintenance  Topic Date Due   COVID-19 Vaccine (6 - 2024-25 season) 05/21/2023   Colonoscopy  01/10/2024   Medicare Annual Wellness (AWV)  04/20/2024   INFLUENZA VACCINE  04/19/2024   DTaP/Tdap/Td (2 - Td or Tdap) 04/15/2025   MAMMOGRAM  01/09/2026   Pneumococcal Vaccine: 50+ Years  Completed   DEXA SCAN  Completed   Hepatitis C Screening  Completed   Zoster Vaccines- Shingrix  Completed   Hepatitis B Vaccines  Aged Out  HPV VACCINES  Aged Out   Meningococcal B Vaccine  Aged Out     Health Maintenance  Health Maintenance Due  Topic Date Due   COVID-19 Vaccine (6 - 2024-25 season) 05/21/2023   Colonoscopy  01/10/2024   Medicare Annual Wellness (AWV)  04/20/2024   INFLUENZA VACCINE  04/19/2024   Health Maintenance Items Addressed: See Nurse Notes at the end of this note  Additional Screening:  Vision Screening: Recommended annual ophthalmology exams for early detection of glaucoma and other disorders of the eye. Would you like a referral to an eye doctor? No    Dental Screening: Recommended annual dental exams for proper oral hygiene  Community Resource Referral / Chronic Care Management: CRR required this visit?  No   CCM required this visit?  No   Plan:    I have personally reviewed and noted the following in the patient's chart:   Medical and social history Use of alcohol, tobacco or illicit drugs  Current medications and supplements including opioid prescriptions. Patient is not currently taking opioid prescriptions. Functional ability and status Nutritional status Physical activity Advanced directives List of other physicians Hospitalizations, surgeries, and ER visits in previous 12 months Vitals Screenings to include cognitive, depression, and falls Referrals and appointments  In addition, I have reviewed and discussed with patient certain preventive protocols, quality metrics, and best practice recommendations. A written personalized care plan for preventive services as well as general preventive health recommendations were provided to patient.   Indianna Boran L Dixie Coppa, CMA   04/24/2024   After Visit Summary: (MyChart) Due to this being a telephonic visit, the after visit summary with patients personalized plan was offered to patient via MyChart   Notes: Patient is due for a colonoscopy and would like to discuss with Dr. Rollene during her next office visit.

## 2024-04-24 NOTE — Progress Notes (Signed)
   Subjective:   Patient ID: Abigail Watkins, female    DOB: [birth year], 73 y.o.   MRN: [id number]  The patient is here for physical. Pertinent topics discussed: Discussed the use of AI scribe software for clinical note transcription with the patient, who gave verbal consent to proceed.  History of Present Illness Abigail Watkins is a 73 year old female who presents for routine follow-up.  She has a history of breast cancer diagnosed in 2004. Her family history includes a maternal aunt who died of breast cancer, a maternal grandmother who died of ovarian cancer, and a brother diagnosed with multiple myeloma.  She remains physically active, engaging in walking, yoga, and hiking. She quit smoking decades ago and has not resumed smoking. She has received her mammogram for the year and is up to date on her pneumonia, shingles, and tetanus vaccinations.  PMH, [facility], social history reviewed and updated  Review of Systems  Constitutional: Negative.   HENT: Negative.    Eyes: Negative.   Respiratory:  Negative for cough, chest tightness and shortness of breath.   Cardiovascular:  Negative for chest pain, palpitations and leg swelling.  Gastrointestinal:  Negative for abdominal distention, abdominal pain, constipation, diarrhea, nausea and vomiting.  Musculoskeletal: Negative.   Skin: Negative.   Neurological: Negative.   Psychiatric/Behavioral: Negative.      Objective:  Physical Exam Constitutional:      Appearance: She is well-developed.  HENT:     Head: Normocephalic and atraumatic.  Cardiovascular:     Rate and Rhythm: Normal rate and regular rhythm.  Pulmonary:     Effort: Pulmonary effort is normal. No respiratory distress.     Breath sounds: Normal breath sounds. No wheezing or rales.  Abdominal:     General: Bowel sounds are normal. There is no distension.     Palpations: Abdomen is soft.     Tenderness: There is no abdominal tenderness. There is no rebound.   Musculoskeletal:     Cervical back: Normal range of motion.  Skin:    General: Skin is warm and dry.  Neurological:     Mental Status: She is alert and oriented to person, place, and time.     Coordination: Coordination normal.     Vitals:   04/24/24 1007  BP: (!) 180/100  Pulse: 65  Temp: 98 F (36.7 C)  TempSrc: Oral  SpO2: 97%  Weight: 159 lb (72.1 kg)  Height: 5' 6 (1.676 m)    Assessment & Plan:

## 2024-04-24 NOTE — Patient Instructions (Signed)
 We will check the labs today.

## 2024-04-25 NOTE — Assessment & Plan Note (Signed)
 Flu shot yearly. Pneumonia complete. Shingrix complete. Tetanus upto date. Cologuard ordered. Mammogram up to date, pap smear aged out and dexa upto date. Counseled about sun safety and mole surveillance. Counseled about the dangers of distracted driving. Given 10 year screening recommendations.

## 2024-04-25 NOTE — Assessment & Plan Note (Signed)
Gets mammogram yearly.

## 2024-04-29 ENCOUNTER — Ambulatory Visit: Payer: Self-pay | Admitting: Internal Medicine

## 2024-06-05 DIAGNOSIS — Z1211 Encounter for screening for malignant neoplasm of colon: Secondary | ICD-10-CM | POA: Diagnosis not present

## 2024-06-14 ENCOUNTER — Encounter: Payer: Self-pay | Admitting: Internal Medicine

## 2024-06-14 LAB — COLOGUARD: COLOGUARD: NEGATIVE

## 2024-06-18 LAB — COLOGUARD: Cologuard: NEGATIVE

## 2025-04-25 ENCOUNTER — Ambulatory Visit
